# Patient Record
Sex: Female | Born: 1985
Health system: Southern US, Community
[De-identification: ages and names within clinical notes are randomized; demographics above are authoritative.]

## PROBLEM LIST (undated history)

## (undated) ENCOUNTER — Inpatient Hospital Stay (HOSPITAL_COMMUNITY): Payer: Self-pay

## (undated) DIAGNOSIS — Z789 Other specified health status: Secondary | ICD-10-CM

## (undated) HISTORY — PX: NO PAST SURGERIES: SHX2092

## (undated) HISTORY — PX: APPENDECTOMY: SHX54

## (undated) HISTORY — PX: BREAST ENHANCEMENT SURGERY: SHX7

---
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
## 2005-02-02 ENCOUNTER — Other Ambulatory Visit: Admission: RE | Admit: 2005-02-02 | Discharge: 2005-02-02 | Payer: Self-pay | Admitting: Family Medicine
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
## 2006-05-03 ENCOUNTER — Other Ambulatory Visit: Admission: RE | Admit: 2006-05-03 | Discharge: 2006-05-03 | Payer: Self-pay | Admitting: Family Medicine

## 2006-12-11 ENCOUNTER — Encounter: Admission: RE | Admit: 2006-12-11 | Discharge: 2006-12-11 | Payer: Self-pay | Admitting: Family Medicine

## 2006-12-17 ENCOUNTER — Encounter (INDEPENDENT_AMBULATORY_CARE_PROVIDER_SITE_OTHER): Payer: Self-pay | Admitting: Diagnostic Radiology

## 2006-12-17 ENCOUNTER — Encounter: Admission: RE | Admit: 2006-12-17 | Discharge: 2006-12-17 | Payer: Self-pay | Admitting: Family Medicine

## 2007-01-28 ENCOUNTER — Other Ambulatory Visit: Admission: RE | Admit: 2007-01-28 | Discharge: 2007-01-28 | Payer: Self-pay | Admitting: Family Medicine

## 2007-10-13 ENCOUNTER — Other Ambulatory Visit: Admission: RE | Admit: 2007-10-13 | Discharge: 2007-10-13 | Payer: Self-pay | Admitting: Obstetrics and Gynecology

## 2008-04-08 ENCOUNTER — Other Ambulatory Visit: Admission: RE | Admit: 2008-04-08 | Discharge: 2008-04-08 | Payer: Self-pay | Admitting: Obstetrics and Gynecology

## 2008-10-07 ENCOUNTER — Other Ambulatory Visit: Admission: RE | Admit: 2008-10-07 | Discharge: 2008-10-07 | Payer: Self-pay | Admitting: Obstetrics and Gynecology

## 2009-03-09 ENCOUNTER — Other Ambulatory Visit: Admission: RE | Admit: 2009-03-09 | Discharge: 2009-03-09 | Payer: Self-pay | Admitting: Obstetrics and Gynecology

## 2010-05-30 ENCOUNTER — Ambulatory Visit
Admission: RE | Admit: 2010-05-30 | Discharge: 2010-05-30 | Payer: Self-pay | Source: Home / Self Care | Attending: Urology | Admitting: Urology

## 2010-06-01 ENCOUNTER — Other Ambulatory Visit
Admission: RE | Admit: 2010-06-01 | Discharge: 2010-06-01 | Payer: Self-pay | Source: Home / Self Care | Admitting: Obstetrics and Gynecology

## 2010-08-07 LAB — POCT HEMOGLOBIN-HEMACUE: Hemoglobin: 13.6 g/dL (ref 12.0–15.0)

## 2010-08-07 LAB — POCT PREGNANCY, URINE: Preg Test, Ur: NEGATIVE

## 2011-05-31 ENCOUNTER — Other Ambulatory Visit: Payer: Self-pay | Admitting: Nurse Practitioner

## 2011-05-31 ENCOUNTER — Other Ambulatory Visit (HOSPITAL_COMMUNITY)
Admission: RE | Admit: 2011-05-31 | Discharge: 2011-05-31 | Disposition: A | Discharge status: Home / Self Care | Payer: 59 | Source: Ambulatory Visit | Attending: Obstetrics and Gynecology | Admitting: Obstetrics and Gynecology

## 2011-05-31 DIAGNOSIS — Z01419 Encounter for gynecological examination (general) (routine) without abnormal findings: Secondary | ICD-10-CM | POA: Insufficient documentation

## 2011-05-31 DIAGNOSIS — Z113 Encounter for screening for infections with a predominantly sexual mode of transmission: Secondary | ICD-10-CM | POA: Insufficient documentation

## 2012-01-16 ENCOUNTER — Other Ambulatory Visit: Payer: Self-pay | Admitting: Surgery

## 2012-05-22 ENCOUNTER — Other Ambulatory Visit (HOSPITAL_COMMUNITY)
Admission: RE | Admit: 2012-05-22 | Discharge: 2012-05-22 | Disposition: A | Discharge status: Home / Self Care | Payer: Managed Care, Other (non HMO) | Source: Ambulatory Visit | Attending: Obstetrics and Gynecology | Admitting: Obstetrics and Gynecology

## 2012-05-22 ENCOUNTER — Other Ambulatory Visit: Payer: Self-pay | Admitting: Nurse Practitioner

## 2012-05-22 DIAGNOSIS — Z01419 Encounter for gynecological examination (general) (routine) without abnormal findings: Secondary | ICD-10-CM | POA: Insufficient documentation

## 2013-12-27 ENCOUNTER — Emergency Department (HOSPITAL_COMMUNITY)
Admission: EM | Admit: 2013-12-27 | Discharge: 2013-12-27 | Disposition: A | Discharge status: Home / Self Care | Payer: BC Managed Care – PPO | Attending: Emergency Medicine | Admitting: Emergency Medicine

## 2013-12-27 ENCOUNTER — Emergency Department (HOSPITAL_COMMUNITY): Payer: BC Managed Care – PPO

## 2013-12-27 ENCOUNTER — Encounter (HOSPITAL_COMMUNITY): Payer: Self-pay | Admitting: Emergency Medicine

## 2013-12-27 DIAGNOSIS — W208XXA Other cause of strike by thrown, projected or falling object, initial encounter: Secondary | ICD-10-CM | POA: Insufficient documentation

## 2013-12-27 DIAGNOSIS — S8253XA Displaced fracture of medial malleolus of unspecified tibia, initial encounter for closed fracture: Secondary | ICD-10-CM | POA: Insufficient documentation

## 2013-12-27 DIAGNOSIS — S9030XA Contusion of unspecified foot, initial encounter: Secondary | ICD-10-CM | POA: Insufficient documentation

## 2013-12-27 DIAGNOSIS — S99919A Unspecified injury of unspecified ankle, initial encounter: Secondary | ICD-10-CM

## 2013-12-27 DIAGNOSIS — Y929 Unspecified place or not applicable: Secondary | ICD-10-CM | POA: Insufficient documentation

## 2013-12-27 DIAGNOSIS — S99929A Unspecified injury of unspecified foot, initial encounter: Secondary | ICD-10-CM

## 2013-12-27 DIAGNOSIS — S8990XA Unspecified injury of unspecified lower leg, initial encounter: Secondary | ICD-10-CM | POA: Insufficient documentation

## 2013-12-27 DIAGNOSIS — S9032XA Contusion of left foot, initial encounter: Secondary | ICD-10-CM

## 2013-12-27 DIAGNOSIS — S82892A Other fracture of left lower leg, initial encounter for closed fracture: Secondary | ICD-10-CM

## 2013-12-27 DIAGNOSIS — Y9389 Activity, other specified: Secondary | ICD-10-CM | POA: Insufficient documentation

## 2013-12-27 MED ORDER — IBUPROFEN 600 MG PO TABS: 600.0000 mg | ORAL_TABLET | Freq: Four times a day (QID) | ORAL | Status: DC | PRN

## 2013-12-27 MED ORDER — IBUPROFEN 800 MG PO TABS
800.0000 mg | ORAL_TABLET | Freq: Once | ORAL | Status: AC
  Administered 2013-12-27: 800 mg via ORAL
  Filled 2013-12-27: qty 1

## 2013-12-27 MED ORDER — HYDROCODONE-ACETAMINOPHEN 5-325 MG PO TABS
1.0000 | ORAL_TABLET | Freq: Once | ORAL | Status: AC
  Administered 2013-12-27: 1 via ORAL
  Filled 2013-12-27: qty 1

## 2013-12-27 MED ORDER — HYDROCODONE-ACETAMINOPHEN 5-325 MG PO TABS: ORAL_TABLET | ORAL | Status: DC

## 2013-12-27 NOTE — Discharge Instructions (Signed)
Ankle Fracture °A fracture is a break in a bone. The ankle joint is made up of three bones. These include the lower (distal) sections of your lower leg bones, called the tibia and fibula, along with a bone in your foot, called the talus. Depending on how bad the break is and if more than one ankle joint bone is broken, a cast or splint is used to protect and keep your injured bone from moving while it heals. Sometimes, surgery is required to help the fracture heal properly.  °There are two general types of fractures: °· Stable fracture. This includes a single fracture line through one bone, with no injury to ankle ligaments. A fracture of the talus that does not have any displacement (movement of the bone on either side of the fracture line) is also stable. °· Unstable fracture. This includes more than one fracture line through one or more bones in the ankle joint. It also includes fractures that have displacement of the bone on either side of the fracture line. °CAUSES °· A direct blow to the ankle.   °· Quickly and severely twisting your ankle. °· Trauma, such as a car accident or falling from a significant height. °RISK FACTORS °You may be at a higher risk of ankle fracture if: °· You have certain medical conditions. °· You are involved in high-impact sports. °· You are involved in a high-impact car accident. °SIGNS AND SYMPTOMS  °· Tender and swollen ankle. °· Bruising around the injured ankle. °· Pain on movement of the ankle. °· Difficulty walking or putting weight on the ankle. °· A cold foot below the site of the ankle injury. This can occur if the blood vessels passing through your injured ankle were also damaged. °· Numbness in the foot below the site of the ankle injury. °DIAGNOSIS  °An ankle fracture is usually diagnosed with a physical exam and X-rays. A CT scan may also be required for complex fractures. °TREATMENT  °Stable fractures are treated with a cast or splint and using crutches to avoid putting  weight on your injured ankle. This is followed by an ankle strengthening program. Some patients require a special type of cast, depending on other medical problems they may have. Unstable fractures require surgery to ensure the bones heal properly. Your health care provider will tell you what type of fracture you have and the best treatment for your condition. °HOME CARE INSTRUCTIONS  °· Review correct crutch use with your health care provider and use your crutches as directed. Safe use of crutches is extremely important. Misuse of crutches can cause you to fall or cause injury to nerves in your hands or armpits. °· Do not put weight or pressure on the injured ankle until directed by your health care provider. °· To lessen the swelling, keep the injured leg elevated while sitting or lying down. °· Apply ice to the injured area: °¨ Put ice in a plastic bag. °¨ Place a towel between your cast and the bag. °¨ Leave the ice on for 20 minutes, 2-3 times a day. °· If you have a plaster or fiberglass cast: °¨ Do not try to scratch the skin under the cast with any objects. This can increase your risk of skin infection. °¨ Check the skin around the cast every day. You may put lotion on any red or sore areas. °¨ Keep your cast dry and clean. °· If you have a plaster splint: °¨ Wear the splint as directed. °¨ You may loosen the elastic   around the splint if your toes become numb, tingle, or turn cold or blue.  Do not put pressure on any part of your cast or splint; it may break. Rest your cast only on a pillow the first 24 hours until it is fully hardened.  Your cast or splint can be protected during bathing with a plastic bag sealed to your skin with medical tape. Do not lower the cast or splint into water.  Take medicines as directed by your health care provider. Only take over-the-counter or prescription medicines for pain, discomfort, or fever as directed by your health care provider.  Do not drive a vehicle until  your health care provider specifically tells you it is safe to do so.  If your health care provider has given you a follow-up appointment, it is very important to keep that appointment. Not keeping the appointment could result in a chronic or permanent injury, pain, and disability. If you have any problem keeping the appointment, call the facility for assistance. SEEK MEDICAL CARE IF: You develop increased swelling or discomfort. SEEK IMMEDIATE MEDICAL CARE IF:   Your cast gets damaged or breaks.  You have continued severe pain.  You develop new pain or swelling after the cast was put on.  Your skin or toenails below the injury turn blue or gray.  Your skin or toenails below the injury feel cold, numb, or have loss of sensitivity to touch.  There is a bad smell or pus draining from under the cast. MAKE SURE YOU:   Understand these instructions.  Will watch your condition.  Will get help right away if you are not doing well or get worse. Document Released: 05/11/2000 Document Revised: 05/19/2013 Document Reviewed: 12/11/2012 [facility] Patient Information 2015 [place], [place]. This information is not intended to replace advice given to you by your health care provider. Make sure you discuss any questions you have with your health care provider.  Contusion A contusion is a deep bruise. Contusions happen when an injury causes bleeding under the skin. Signs of bruising include pain, puffiness (swelling), and discolored skin. The contusion may turn blue, purple, or yellow. HOME CARE   Put ice on the injured area.  Put ice in a plastic bag.  Place a towel between your skin and the bag.  Leave the ice on for 15-20 minutes, 03-04 times a day.  Only take medicine as told by your doctor.  Rest the injured area.  If possible, raise (elevate) the injured area to lessen puffiness. GET HELP RIGHT AWAY IF:   You have more bruising or puffiness.  You have pain that is getting  worse.  Your puffiness or pain is not helped by medicine. MAKE SURE YOU:   Understand these instructions.  Will watch your condition.  Will get help right away if you are not doing well or get worse. Document Released: 10/31/2007 Document Revised: 08/06/2011 Document Reviewed: 03/19/2011 [facility] Patient Information 2015 [place], [place]. This information is not intended to replace advice given to you by your health care provider. Make sure you discuss any questions you have with your health care provider.
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
## 2013-12-30 NOTE — ED Provider Notes (Signed)
Medical screening examination/treatment/procedure(s) were performed by non-physician practitioner and as supervising physician I was immediately available for consultation/collaboration.   EKG Interpretation None        Samuel JesterKathleen Conda Wannamaker, DO 12/30/13 1631

## 2014-08-19 ENCOUNTER — Other Ambulatory Visit: Payer: Self-pay | Admitting: Nurse Practitioner

## 2014-08-19 ENCOUNTER — Other Ambulatory Visit (HOSPITAL_COMMUNITY)
Admission: RE | Admit: 2014-08-19 | Discharge: 2014-08-19 | Disposition: A | Discharge status: Home / Self Care | Payer: BLUE CROSS/BLUE SHIELD | Source: Ambulatory Visit | Attending: Nurse Practitioner | Admitting: Nurse Practitioner

## 2014-08-19 DIAGNOSIS — Z01419 Encounter for gynecological examination (general) (routine) without abnormal findings: Secondary | ICD-10-CM | POA: Insufficient documentation

## 2014-08-19 DIAGNOSIS — Z1151 Encounter for screening for human papillomavirus (HPV): Secondary | ICD-10-CM | POA: Insufficient documentation

## 2014-08-24 LAB — CYTOLOGY - PAP

## 2015-01-13 LAB — OB RESULTS CONSOLE RUBELLA ANTIBODY, IGM: Rubella: IMMUNE

## 2015-01-13 LAB — OB RESULTS CONSOLE ANTIBODY SCREEN: ANTIBODY SCREEN: NEGATIVE

## 2015-01-13 LAB — OB RESULTS CONSOLE HIV ANTIBODY (ROUTINE TESTING): HIV: NONREACTIVE

## 2015-01-13 LAB — OB RESULTS CONSOLE HEPATITIS B SURFACE ANTIGEN: Hepatitis B Surface Ag: NEGATIVE

## 2015-01-13 LAB — OB RESULTS CONSOLE ABO/RH: RH TYPE: POSITIVE

## 2015-01-13 LAB — OB RESULTS CONSOLE RPR: RPR: NONREACTIVE

## 2015-02-04 LAB — OB RESULTS CONSOLE GC/CHLAMYDIA
Chlamydia: NEGATIVE
Gonorrhea: NEGATIVE

## 2015-04-14 IMAGING — CR DG OS CALCIS 2+V*L*
2 series · 2 of 2 positions shown · non-contrast
Comparison: None.

CLINICAL DATA: Os calcis

EXAM:
LEFT OS CALCIS - 2+ VIEW

[view not recorded (1 of 2)]
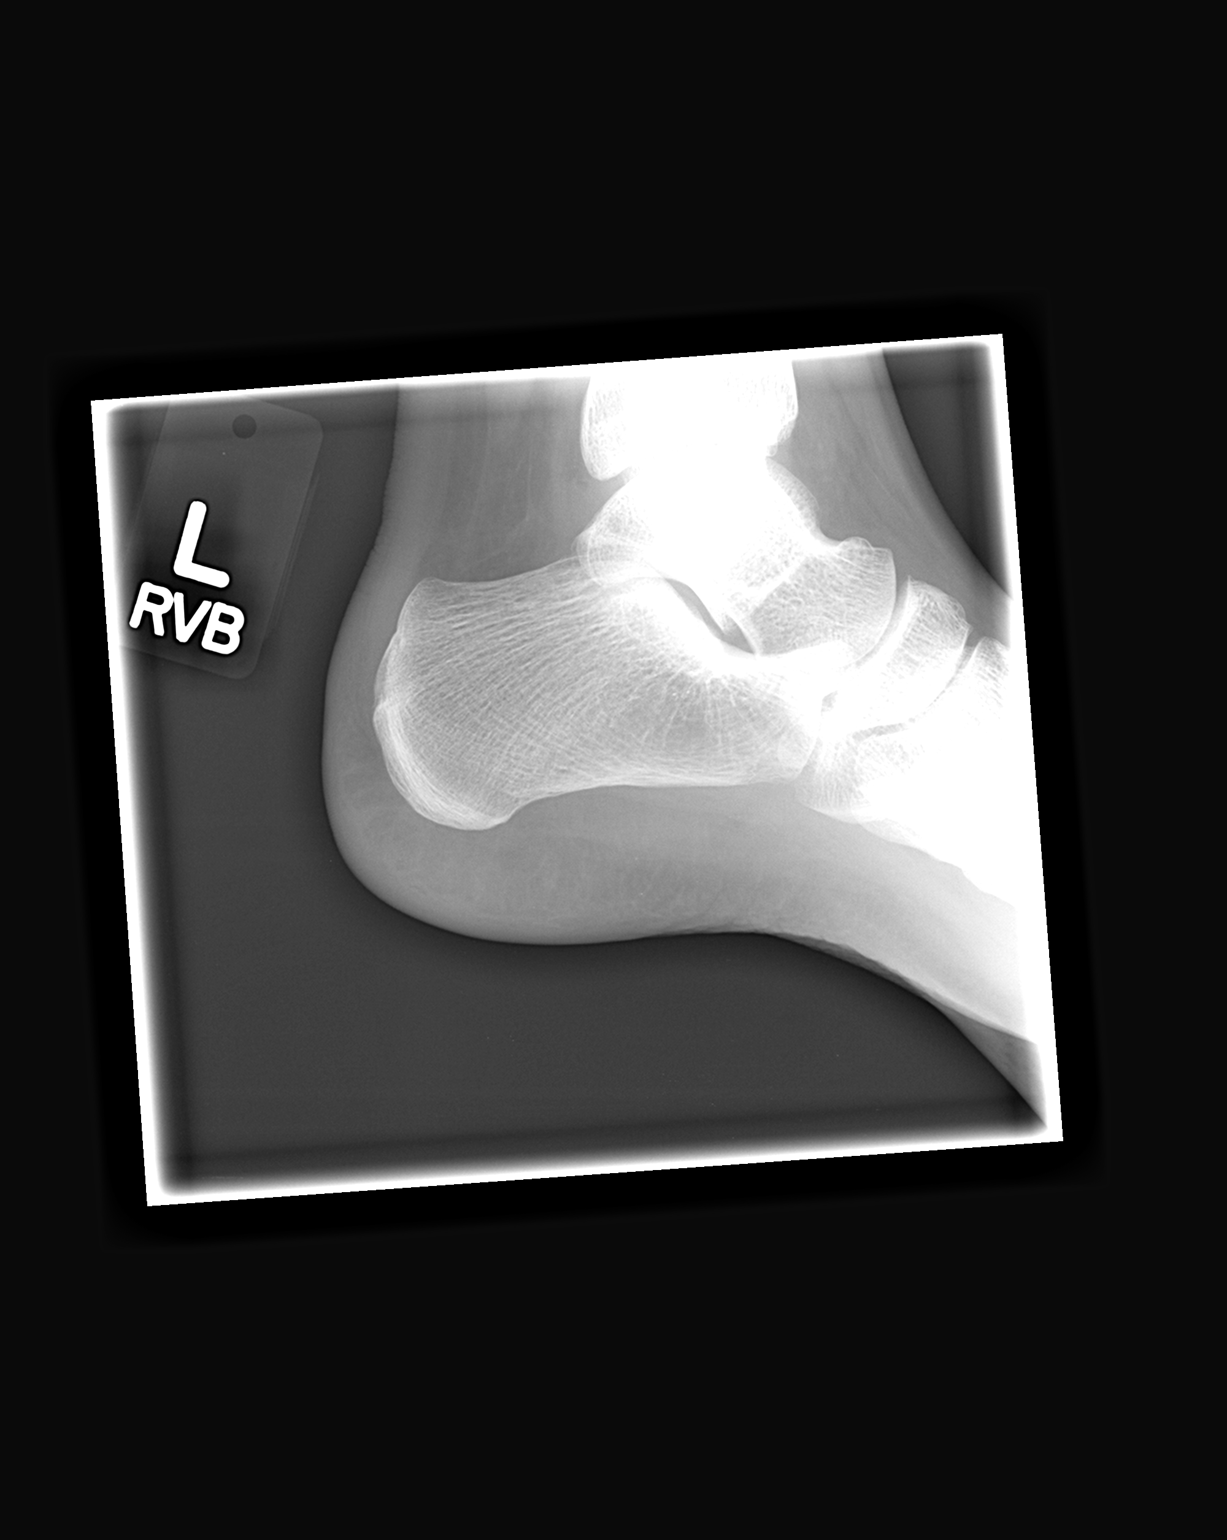

[view not recorded (2 of 2)]
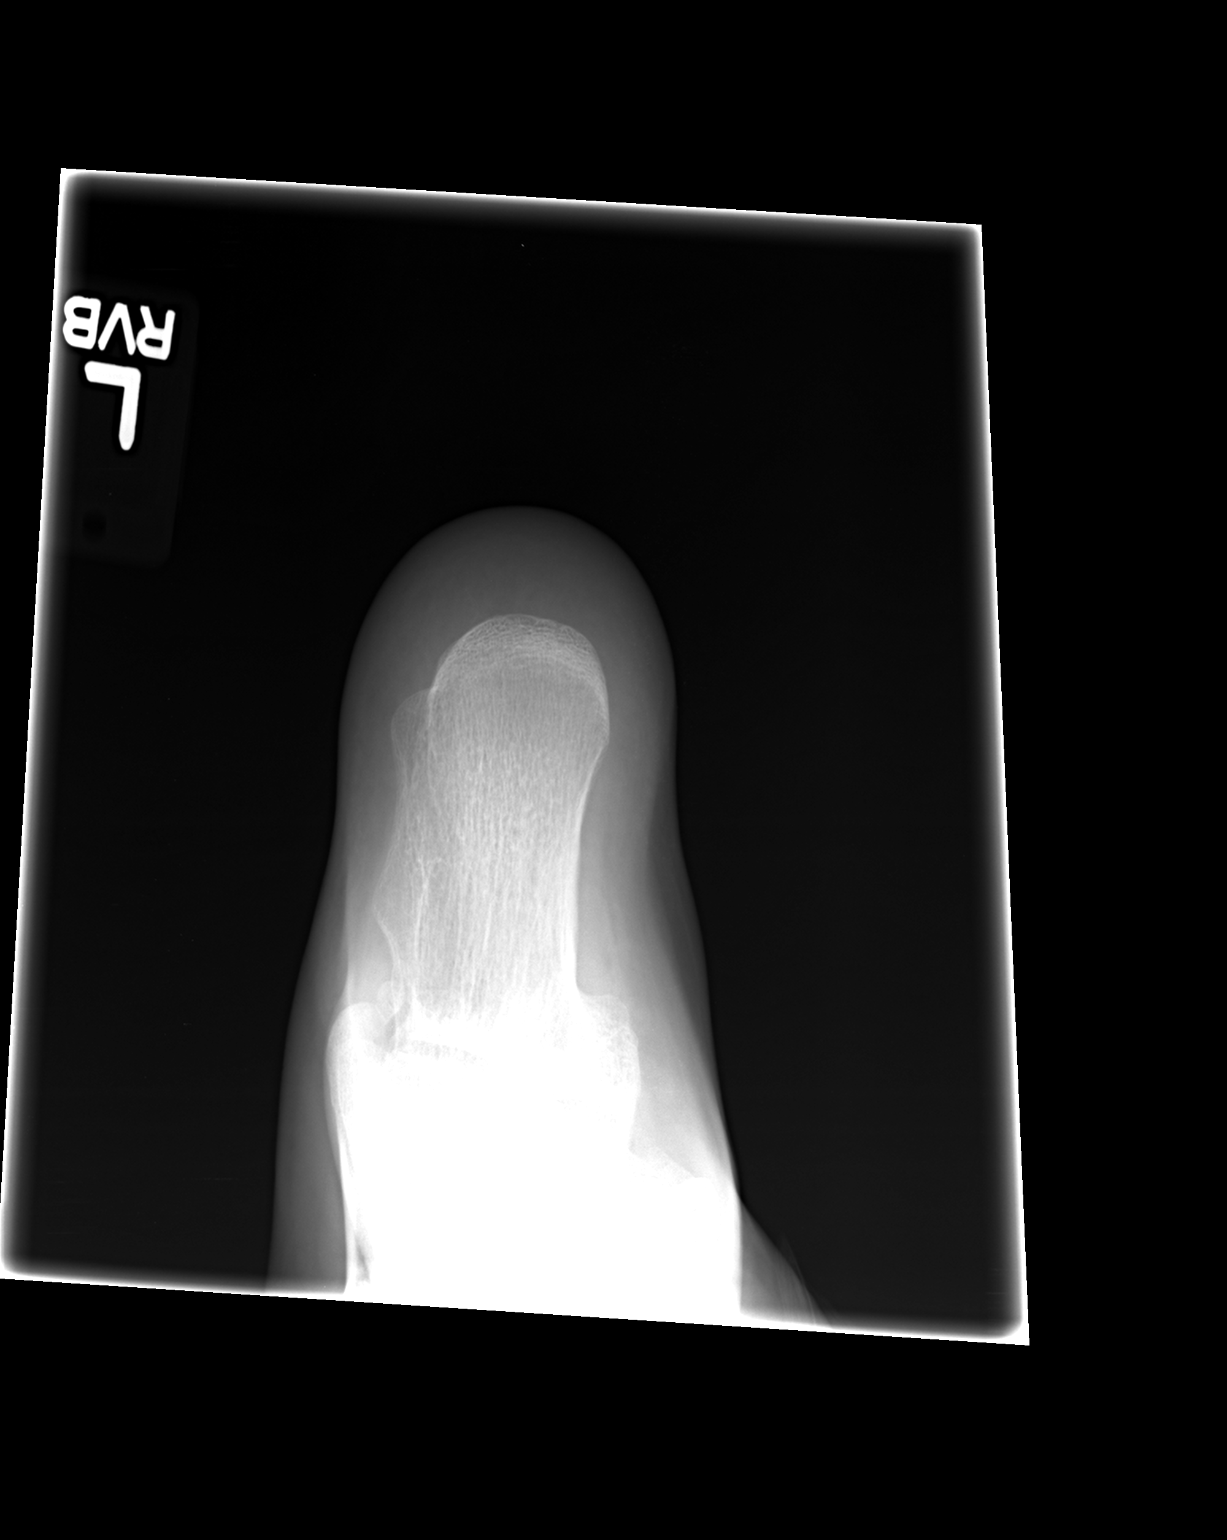

[2 of 2 positions shown; findings below may reference images not displayed]

FINDINGS: No fracture.  Note dislocation.
IMPRESSION: No acute bony pathology.

## 2015-07-17 ENCOUNTER — Encounter (HOSPITAL_COMMUNITY): Payer: Self-pay | Admitting: *Deleted

## 2015-07-17 ENCOUNTER — Inpatient Hospital Stay (HOSPITAL_COMMUNITY)
Admission: AD | Admit: 2015-07-17 | Discharge: 2015-07-17 | Disposition: A | Discharge status: Home / Self Care | Payer: BLUE CROSS/BLUE SHIELD | Source: Ambulatory Visit | Attending: Obstetrics & Gynecology | Admitting: Obstetrics & Gynecology

## 2015-07-17 DIAGNOSIS — R059 Cough, unspecified: Secondary | ICD-10-CM

## 2015-07-17 DIAGNOSIS — R1032 Left lower quadrant pain: Secondary | ICD-10-CM | POA: Diagnosis not present

## 2015-07-17 DIAGNOSIS — O9989 Other specified diseases and conditions complicating pregnancy, childbirth and the puerperium: Secondary | ICD-10-CM

## 2015-07-17 DIAGNOSIS — R1031 Right lower quadrant pain: Secondary | ICD-10-CM | POA: Insufficient documentation

## 2015-07-17 DIAGNOSIS — M549 Dorsalgia, unspecified: Secondary | ICD-10-CM | POA: Diagnosis not present

## 2015-07-17 DIAGNOSIS — R05 Cough: Secondary | ICD-10-CM | POA: Diagnosis not present

## 2015-07-17 DIAGNOSIS — Z3A33 33 weeks gestation of pregnancy: Secondary | ICD-10-CM | POA: Insufficient documentation

## 2015-07-17 DIAGNOSIS — O26893 Other specified pregnancy related conditions, third trimester: Secondary | ICD-10-CM | POA: Diagnosis not present

## 2015-07-17 DIAGNOSIS — O99891 Other specified diseases and conditions complicating pregnancy: Secondary | ICD-10-CM

## 2015-07-17 DIAGNOSIS — R109 Unspecified abdominal pain: Secondary | ICD-10-CM | POA: Diagnosis present

## 2015-07-17 HISTORY — DX: Other specified health status: Z78.9

## 2015-07-17 LAB — URINALYSIS, ROUTINE W REFLEX MICROSCOPIC
Bilirubin Urine: NEGATIVE
Glucose, UA: NEGATIVE mg/dL
Hgb urine dipstick: NEGATIVE
Ketones, ur: NEGATIVE mg/dL
Leukocytes, UA: NEGATIVE
Nitrite: NEGATIVE
Protein, ur: NEGATIVE mg/dL
Specific Gravity, Urine: <1.005 — ABNORMAL LOW (ref 1.005–1.030)
pH: 5.5 (ref 5.0–8.0)

## 2015-07-17 MED ORDER — CYCLOBENZAPRINE HCL 10 MG PO TABS: 10.0000 mg | ORAL_TABLET | Freq: Two times a day (BID) | ORAL | Status: DC | PRN

## 2015-07-17 MED ORDER — BENZONATATE 200 MG PO CAPS: 200.0000 mg | ORAL_CAPSULE | Freq: Three times a day (TID) | ORAL | Status: DC | PRN

## 2015-07-17 MED ORDER — CYCLOBENZAPRINE HCL 10 MG PO TABS
10.0000 mg | ORAL_TABLET | Freq: Once | ORAL | Status: AC
  Administered 2015-07-17: 10 mg via ORAL
  Filled 2015-07-17: qty 1

## 2015-07-17 NOTE — Discharge Instructions (Signed)
Abdominal Pain During Pregnancy Abdominal pain is common in pregnancy. Most of the time, it does not cause harm. There are many causes of abdominal pain. Some causes are more serious than others. Some of the causes of abdominal pain in pregnancy are easily diagnosed. Occasionally, the diagnosis takes time to understand. Other times, the cause is not determined. Abdominal pain can be a sign that something is very wrong with the pregnancy, or the pain may have nothing to do with the pregnancy at all. For this reason, always tell your health care provider if you have any abdominal discomfort. HOME CARE INSTRUCTIONS  Monitor your abdominal pain for any changes. The following actions may help to alleviate any discomfort you are experiencing:  Do not have sexual intercourse or put anything in your vagina until your symptoms go away completely.  Get plenty of rest until your pain improves.  Drink clear fluids if you feel nauseous. Avoid solid food as long as you are uncomfortable or nauseous.  Only take over-the-counter or prescription medicine as directed by your health care provider.  Keep all follow-up appointments with your health care provider. SEEK IMMEDIATE MEDICAL CARE IF:  You are bleeding, leaking fluid, or passing tissue from the vagina.  You have increasing pain or cramping.  You have persistent vomiting.  You have painful or bloody urination.  You have a fever.  You notice a decrease in your baby's movements.  You have extreme weakness or feel faint.  You have shortness of breath, with or without abdominal pain.  You develop a severe headache with abdominal pain.  You have abnormal vaginal discharge with abdominal pain.  You have persistent diarrhea.  You have abdominal pain that continues even after rest, or gets worse. MAKE SURE YOU:   Understand these instructions.  Will watch your condition.  Will get help right away if you are not doing well or get worse.     This information is not intended to replace advice given to you by your health care provider. Make sure you discuss any questions you have with your health care provider.   Document Released: 05/14/2005 Document Revised: 03/04/2013 Document Reviewed: 12/11/2012 Elsevier Interactive Patient Education 2016 [employer] Upper Respiratory Infection, Adult Most upper respiratory infections (URIs) are a viral infection of the air passages leading to the lungs. A URI affects the nose, throat, and upper air passages. The most common type of URI is nasopharyngitis and is typically referred to as "the common cold." URIs run their course and usually go away on their own. Most of the time, a URI does not require medical attention, but sometimes a bacterial infection in the upper airways can follow a viral infection. This is called a secondary infection. Sinus and middle ear infections are common types of secondary upper respiratory infections. Bacterial pneumonia can also complicate a URI. A URI can worsen asthma and chronic obstructive pulmonary disease (COPD). Sometimes, these complications can require emergency medical care and may be life threatening.  CAUSES Almost all URIs are caused by viruses. A virus is a type of germ and can spread from one person to another.  RISKS FACTORS You may be at risk for a URI if:   You smoke.   You have chronic heart or lung disease.  You have a weakened defense (immune) system.   You are very young or very old.   You have nasal allergies or asthma.  You work in crowded or poorly ventilated areas.  You work in [employer]  care facilities or schools. SIGNS AND SYMPTOMS  Symptoms typically develop 2-3 days after you come in contact with a cold virus. Most viral URIs last 7-10 days. However, viral URIs from the influenza virus (flu virus) can last 14-18 days and are typically more severe. Symptoms may include:   Runny or stuffy (congested) nose.   Sneezing.    Cough.   Sore throat.   Headache.   Fatigue.   Fever.   Loss of appetite.   Pain in your forehead, behind your eyes, and over your cheekbones (sinus pain).  Muscle aches.  DIAGNOSIS  Your health care provider may diagnose a URI by:  Physical exam.  Tests to check that your symptoms are not due to another condition such as:  Strep throat.  Sinusitis.  Pneumonia.  Asthma. TREATMENT  A URI goes away on its own with time. It cannot be cured with medicines, but medicines may be prescribed or recommended to relieve symptoms. Medicines may help:  Reduce your fever.  Reduce your cough.  Relieve nasal congestion. HOME CARE INSTRUCTIONS   Take medicines only as directed by your health care provider.   Gargle warm saltwater or take cough drops to comfort your throat as directed by your health care provider.  Use a warm mist humidifier or inhale steam from a shower to increase air moisture. This may make it easier to breathe.  Drink enough fluid to keep your urine clear or pale yellow.   Eat soups and other clear broths and maintain good nutrition.   Rest as needed.   Return to work when your temperature has returned to normal or as your health care provider advises. You may need to stay home longer to avoid infecting others. You can also use a face mask and careful hand washing to prevent spread of the virus.  Increase the usage of your inhaler if you have asthma.   Do not use any tobacco products, including cigarettes, chewing tobacco, or electronic cigarettes. If you need help quitting, ask your health care provider. PREVENTION  The best way to protect yourself from getting a cold is to practice good hygiene.   Avoid oral or hand contact with people with cold symptoms.   Wash your hands often if contact occurs.  There is no clear evidence that vitamin C, vitamin E, echinacea, or exercise reduces the chance of developing a cold. However, it is  always recommended to get plenty of rest, exercise, and practice good nutrition.  SEEK MEDICAL CARE IF:   You are getting worse rather than better.   Your symptoms are not controlled by medicine.   You have chills.  You have worsening shortness of breath.  You have brown or red mucus.  You have yellow or brown nasal discharge.  You have pain in your face, especially when you bend forward.  You have a fever.  You have swollen neck glands.  You have pain while swallowing.  You have white areas in the back of your throat. SEEK IMMEDIATE MEDICAL CARE IF:   You have severe or persistent:  Headache.  Ear pain.  Sinus pain.  Chest pain.  You have chronic lung disease and any of the following:  Wheezing.  Prolonged cough.  Coughing up blood.  A change in your usual mucus.  You have a stiff neck.  You have changes in your:  Vision.  Hearing.  Thinking.  Mood. MAKE SURE YOU:   Understand these instructions.  Will watch your condition.  Will  get help right away if you are not doing well or get worse.   This information is not intended to replace advice given to you by your health care provider. Make sure you discuss any questions you have with your health care provider.   Document Released: 11/07/2000 Document Revised: 09/28/2014 Document Reviewed: 08/19/2013 Elsevier Interactive Patient Education 2016 [employer].   Safe Medications in Pregnancy   Acne:  Benzoyl Peroxide  Salicylic Acid   Backache/Headache:  Tylenol: 2 regular strength every 4 hours OR        2 Extra strength every 6 hours   Colds/Coughs/Allergies:  Benadryl (alcohol free) 25 mg every 6 hours as needed  Breath right strips  Claritin  Cepacol throat lozenges  Chloraseptic throat spray  Cold-Eeze- up to three times per day  Cough drops, alcohol free  Flonase (by prescription only)  Guaifenesin  Mucinex  Robitussin DM (plain only, alcohol free)  Saline nasal  spray/drops  Sudafed (pseudoephedrine) & Actifed * use only after [redacted] weeks gestation and if you do not have high blood pressure  Tylenol  Vicks Vaporub  Zinc lozenges  Zyrtec   Constipation:  Colace  Ducolax suppositories  Fleet enema  Glycerin suppositories  Metamucil  Milk of magnesia  Miralax  Senokot  Smooth move tea   Diarrhea:  Kaopectate  Imodium A-D   *NO pepto Bismol   Hemorrhoids:  Anusol  Anusol HC  Preparation H  Tucks   Indigestion:  Tums  Maalox  Mylanta  Zantac  Pepcid   Insomnia:  Benadryl (alcohol free)  every 6 hours as needed  Tylenol PM  Unisom, no Gelcaps   Leg Cramps:  Tums  MagGel   Nausea/Vomiting:  Bonine  Dramamine  Emetrol  Ginger extract  Sea bands  Meclizine  Nausea medication to take during pregnancy:  Unisom (doxylamine succinate 25 mg tablets) Take one tablet daily at bedtime. If symptoms are not adequately controlled, the dose can be increased to a maximum recommended dose of two tablets daily (1/2 tablet in the morning, 1/2 tablet mid-afternoon and one at bedtime).  Vitamin B6  tablets. Take one tablet twice a day (up to 200 mg per day).   Skin Rashes:  Aveeno products  Benadryl cream or  every 6 hours as needed  Calamine Lotion  1% cortisone cream   Yeast infection:  Gyne-lotrimin 7  Monistat 7    **If taking multiple medications, please check labels to avoid duplicating the same active ingredients  **take medication as directed on the label  ** Do not exceed 4000 mg of tylenol in 24 hours  **Do not take medications that contain aspirin or ibuprofen

## 2015-07-17 NOTE — MAU Note (Signed)
Pt states she has had a cold for the past few day and has been coughing.  Pt states she has a physical therapy appointment for tomorrow because she was told the baby is lying on a nerve.  Pt states she is feeling the baby move.

## 2015-07-17 NOTE — MAU Provider Note (Signed)
History     CSN: [id number]  Arrival date and time:[id number]   None     Chief Complaint  Patient presents with  . Abdominal Cramping   HPI   Ms.Robin Palmer is a 30 y.o. female G1P0 at [redacted]w[redacted]d presenting to MAU with abdominal cramping, cough and lower back pain. + cough for a few days now. Taking over the counter theraflu with some relief. Occasionally the cough is keeping her awake at night. Denies fever.   Her lower back is constant, it has gotten worse in the third trimester of pregnancy. She is not wearing a pregnancy support belt.  The abdominal cramping is located in the lower part of her abdomen worse on the left side. The cramping started at 0700. She has tried taking tylenol, which has not helped. Nothing makes the pain worse, nothing makes the pain better.  The pain does not radiate  + fetal movement Denies vaginal bleeding.   Next appointment in the office is March 2nd No intercourse in the last 24 hours.  OB History    Gravida Para Term Preterm AB TAB SAB Ectopic Multiple Living   1               Past Medical History  Diagnosis Date  . Medical history non-contributory     Past Surgical History  Procedure Laterality Date  . No past surgeries      History reviewed. No pertinent family history.  Social History  Substance Use Topics  . Smoking status: Never Smoker   . Smokeless tobacco: None  . Alcohol Use: No    Allergies: No Known Allergies  Prescriptions prior to admission  Medication Sig Dispense Refill Last Dose  . HYDROcodone-acetaminophen (NORCO/VICODIN) 5-325 MG per tablet Take one-two tabs po q 4-6 hrs prn pain 20 tablet 0   . ibuprofen (ADVIL,MOTRIN) 600 MG tablet Take 1 tablet (600 mg total) by mouth every 6 (six) hours as needed. 21 tablet 0   . Levonorgestrel-Ethinyl Estrad (AVIANE PO) Take 1 tablet by mouth daily.   12/26/2013   Results for orders placed or performed during the hospital encounter of 07/17/15 (from the past 48  hour(s))  Urinalysis, Routine w reflex microscopic (not at [facility])     Status: Abnormal   Collection Time: 07/17/15  5:27 PM  Result Value Ref Range   Color, Urine YELLOW YELLOW   APPearance CLEAR CLEAR   Specific Gravity, Urine <1.005 (L) 1.005 - 1.030   pH 5.5 5.0 - 8.0   Glucose, UA NEGATIVE NEGATIVE mg/dL   Hgb urine dipstick NEGATIVE NEGATIVE   Bilirubin Urine NEGATIVE NEGATIVE   Ketones, ur NEGATIVE NEGATIVE mg/dL   Protein, ur NEGATIVE NEGATIVE mg/dL   Nitrite NEGATIVE NEGATIVE   Leukocytes, UA NEGATIVE NEGATIVE    Comment: MICROSCOPIC NOT DONE ON URINES WITH NEGATIVE PROTEIN, BLOOD, LEUKOCYTES, NITRITE, OR GLUCOSE <1000 mg/dL.    Review of Systems  Constitutional: Negative for fever and chills.  Respiratory: Positive for cough.   Gastrointestinal: Positive for abdominal pain. Negative for diarrhea and constipation.   Physical Exam   Blood pressure 116/85, pulse 99, temperature 98.1 F (36.7 C), temperature source Oral, resp. rate 16, last menstrual period 11/27/2014.  Physical Exam  Constitutional: She is oriented to person, place, and time. She appears well-developed and well-nourished. No distress.  HENT:  Head: Normocephalic.  Eyes: Pupils are equal, round, and reactive to light.  Cardiovascular: Normal rate and normal heart sounds.   Respiratory: Effort normal  and breath sounds normal. No respiratory distress. She has no wheezes. She has no rales. She exhibits no tenderness.  GI: Soft. She exhibits no distension. There is no tenderness. There is no rebound.  Genitourinary:  Dilation: Closed Cervical Position: Posterior Exam by:: Venia Carbon, NP  Musculoskeletal: Normal range of motion.  Neurological: She is alert and oriented to person, place, and time.  Skin: Skin is warm. She is not diaphoretic.  Psychiatric: Her behavior is normal.    Fetal Tracing: Baseline: 125 bpm  Variability: moderate  Accelerations: 15x15 Decelerations: None Toco: UI  MAU  Course  Procedures  None   MDM Flexeril 10 mg for lower back pain Discussed HPI< Labs, and plan of care in MAU with Dr. Sallye Ober.   Patient's back pain is down to a 2  Assessment and Plan   A:  1. Back pain affecting pregnancy in third trimester   2. Bilateral lower abdominal cramping   3. Cough     P:  Discharge home in stable condition RX: Tesslon pearls, flexeril Follow up with Dr. Charlotta Newton as scheduled A list of safe over the counter URI medications given Return to MAU if symptoms worsen    Duane Lope, NP 07/17/2015 5:55 PM

## 2015-07-18 ENCOUNTER — Ambulatory Visit: Payer: BLUE CROSS/BLUE SHIELD | Attending: Obstetrics & Gynecology

## 2015-07-18 DIAGNOSIS — M5441 Lumbago with sciatica, right side: Secondary | ICD-10-CM

## 2015-07-18 NOTE — Therapy (Addendum)
[facility] [address] [place], [place], [address] Phone: [phone]   Fax:  [phone]  Physical Therapy Evaluation  Patient Details  Name: Robin Palmer MRN: [id number] Date of Birth: Mar 22, 1986 Referring Provider: Janyth Pupa, DO  Encounter Date: 07/18/2015      PT End of Session - 07/18/15 0925    Visit Number 1   Date for PT Re-Evaluation 09/12/15   PT Start Time 0851   PT Stop Time 0925   PT Time Calculation (min) 34 min   Activity Tolerance Patient tolerated treatment well   Behavior During Therapy [facility] for tasks assessed/performed      Past Medical History  Diagnosis Date  . Medical history non-contributory     Past Surgical History  Procedure Laterality Date  . No past surgeries      There were no vitals filed for this visit.  Visit Diagnosis:  Bilateral low back pain with right-sided sciatica - Plan: PT plan of care cert/re-cert      Subjective Assessment - 07/18/15 0859    Subjective Pt is a 30 y.o. female in her 3rd trimester of pregnancy who presents to PT with 3 month history of LBP and 1.5 month history of Rt LE radiculopathy.  No incident or injury.  No imaging due to pregnancy.     Pertinent History none   Limitations Standing;Sitting   How long can you sit comfortably? sometimes limited if having a bad day   How long can you stand comfortably? 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  Standing limited t o1 hour   Diagnostic tests none   Patient Stated Goals reduce pain   Currently in Pain? Yes   Pain Score 3    Pain Location Back   Pain Orientation Right;Lower;Left   Pain Descriptors / Indicators Aching   Pain Type Acute pain   Pain Radiating Towards Rt leg (up to 5-6/10)   Pain Onset More than a month ago   Pain Frequency Constant   Aggravating Factors  no specific pattern   Pain Relieving Factors nothing per pt report            [facility] PT Assessment - 07/18/15 0001     Assessment   Medical Diagnosis sciatica in pregnancy    Referring Provider Janyth Pupa, DO   Onset Date/Surgical Date 05/17/15   Next MD Visit 07/28/15   Precautions   Precautions Other (comment)  pt is pregnant   Restrictions   Weight Bearing Restrictions No   Balance Screen   Has the patient fallen in the past 6 months No   Has the patient had a decrease in activity level because of a fear of falling?  No   Is the patient reluctant to leave their home because of a fear of falling?  No   Home Environment   Living Environment Private residence   Living Arrangements Spouse/significant other   Type of [place]   Prior Function   Level of Independence Independent   Vocation Full time employment   Vocation Requirements [employer]- works 10 hours   Leisure gym 3x/week   Cognition   Overall Cognitive Status Within Functional Limits for tasks assessed   Observation/Other Assessments   Focus on Therapeutic Outcomes (FOTO)  53% limitation   Posture/Postural Control   Posture/Postural Control Postural limitations   Postural Limitations Increased lumbar lordosis   ROM / Strength   AROM / PROM / Strength AROM;PROM;Strength   AROM  Overall AROM  Within functional limits for tasks performed   Overall AROM Comments Full lumbar AROM wihtout pain today   PROM   Overall PROM  Within functional limits for tasks performed   Overall PROM Comments hamstring flexiblity limited by 20% bilaterally.  All other hip PROM is full without pain   Strength   Overall Strength Within functional limits for tasks performed   Overall Strength Comments 5/5 bil LE strength   Palpation   Palpation comment mild palpable tenderness.  No pain in deep gluteals.     Ambulation/Gait   Ambulation/Gait Yes   Ambulation Distance (Feet) 100 Feet   Gait Pattern Within Functional Limits                           PT Education - 07/18/15 0920    Education provided Yes   Education Details HEP and  body mechanics educaiton   Person(s) Educated Patient   Methods Explanation;Demonstration;Handout   Comprehension Verbalized understanding          PT Short Term Goals - 07/18/15 0929    PT SHORT TERM GOAL #1   Title be independent in initial HEP   Time 4   Period Weeks   Status New   PT SHORT TERM GOAL #2   Title report a 25% reduciton in Rt LE radiculopathy with work and ADLs   Time 4   Period Weeks   Status New           PT Long Term Goals - 07/18/15 0930    PT LONG TERM GOAL #1   Title be independent in advanced HEP   Time 8   Period Weeks   Status New   PT LONG TERM GOAL #2   Title report a 50% reduction in Rt LE radiculopathy with ADLs and work tasks   Time 8   Period Weeks   Status New   PT LONG TERM GOAL #3   Title demonstrate and verbalize correct body mechanics modifications to protect lumbar spine with work and home tasks   Time 8   Period Weeks   Status New   PT LONG TERM GOAL #4   Title report a 50% reduction in LBP with standing at work   Time [place] - 07/18/15 0925    Clinical Impression Statement Pt is a 30 y.o. female in her 3rd trimester of pregnancy who presents to PT with bilateral LBP and Rt LE radiculopathy that began 1.5-3 months ago without cause. Pt rates LBP as 2-3/10 and Rt LE pain as 5-6/10 and this is intermittent.  FOTO score is 53% limitation.  Pt demonstates mild tenderness in the lumbar paraspinals and tension in the deep gluteals.  Pt will benefit from skilled PT for flexiblity, core strength, body mechanics and manual therapy as needed to reduce pain and allow for standing longer at work.     Pt will benefit from skilled therapeutic intervention in order to improve on the following deficits Pain;Postural dysfunction;Improper body mechanics;Decreased activity tolerance   Rehab Potential Good   PT Frequency 1x / week   PT Duration 8 weeks   PT Treatment/Interventions ADLs/Self  Care Home Management;Cryotherapy;Moist Heat;Therapeutic exercise;Therapeutic activities;Functional mobility training;Neuromuscular re-education;Patient/family education;Manual techniques;Passive range of motion   PT Next Visit Plan Practice postural corrections, flexiblity exercises, core strength, manual to  deep gluteals and lumbar paraspinals.     Consulted and Agree with Plan of Care Patient         Problem List There are no active problems to display for this patient.   Ciaran Begay, PT 07/18/2015, 9:33 AM PHYSICAL THERAPY DISCHARGE SUMMARY  Visits from Start of Care: 1  Current functional level related to goals / functional outcomes: Pt attended 1 PT session and then didn't return for further appts.     Remaining deficits: Unknown as pt didn't return to PT.  See above for most current status.     Education / Equipment: HEP Plan: Patient agrees to discharge.  Patient goals were not met. Patient is being discharged due to not returning since the last visit.  ?????    Sigurd Sos, PT 08/17/2015 3:16 PM  [facility] [address], [place], [place], [address] Phone: [phone]   Fax:  [phone]  Name: Robin Palmer MRN: [id number] Date of Birth: [birth year]

## 2015-07-18 NOTE — Patient Instructions (Signed)
   Lifting Principles  .Maintain proper posture and head alignment. .Slide object as close as possible before lifting. .Move obstacles out of the way. .Test before lifting; ask for help if too heavy. .Tighten stomach muscles without holding breath. .Use smooth movements; do not jerk. .Use legs to do the work, and pivot with feet. .Distribute the work load symmetrically and close to the center of trunk. .Push instead of pull whenever possible.   Squat down and hold basket close to stand. Use leg muscles to do the work.    Avoid twisting or bending back. Pivot around using foot movements, and bend at knees if needed when reaching for articles.        Getting Into / Out of Bed   Lower self to lie down on one side by raising legs and lowering head at the same time. Use arms to assist moving without twisting. Bend both knees to roll onto back if desired. To sit up, start from lying on side, and use same move-ments in reverse. Keep trunk aligned with legs.    Shift weight from front foot to back foot as item is lifted off shelf.    When leaning forward to pick object up from floor, extend one leg out behind. Keep back straight. Hold onto a sturdy support with other hand.      Sit upright, head facing forward. Try using a roll to support lower back. Keep shoulders relaxed, and avoid rounded back. Keep hips level with knees. Avoid crossing legs for long periods.    Perform all exercises below:  Hold _20___ seconds. Repeat _3___ times.  Do __3__ sessions per day. CAUTION: Movement should be gentle, steady and slow.  Lumbar Rotation: Caudal - Bilateral (Supine)  Feet and knees together, arms outstretched, rotate knees left, turning head in opposite direction, until stretch is felt.      HIP: Hamstrings - Short Sitting   Rest leg on raised surface. Keep knee straight. Lift chest.   Piriformis Stretch, Sitting    Sit, one ankle on opposite knee, same-side hand  on crossed knee. Push down on knee, keeping spine straight. Lean torso forward, with flat back, until tension is felt in hamstrings and gluteals of crossed-leg side.  [facility] [address], [address] [place], [place] [address] Phone # [phone] Fax [phone]

## 2015-08-09 LAB — OB RESULTS CONSOLE GBS: STREP GROUP B AG: NEGATIVE

## 2015-09-02 ENCOUNTER — Inpatient Hospital Stay (HOSPITAL_COMMUNITY): Payer: BLUE CROSS/BLUE SHIELD | Admitting: Anesthesiology

## 2015-09-02 ENCOUNTER — Inpatient Hospital Stay (HOSPITAL_COMMUNITY)
Admission: AD | Admit: 2015-09-02 | Discharge: 2015-09-05 | DRG: 765 | Disposition: A | Discharge status: Home / Self Care | Payer: BLUE CROSS/BLUE SHIELD | Source: Ambulatory Visit | Attending: Obstetrics & Gynecology | Admitting: Obstetrics & Gynecology

## 2015-09-02 ENCOUNTER — Encounter (HOSPITAL_COMMUNITY): Payer: Self-pay

## 2015-09-02 ENCOUNTER — Encounter (HOSPITAL_COMMUNITY)
Admission: AD | Disposition: A | Discharge status: Home / Self Care | Payer: Self-pay | Source: Ambulatory Visit | Attending: Obstetrics & Gynecology

## 2015-09-02 DIAGNOSIS — Z800 Family history of malignant neoplasm of digestive organs: Secondary | ICD-10-CM | POA: Diagnosis not present

## 2015-09-02 DIAGNOSIS — O9081 Anemia of the puerperium: Secondary | ICD-10-CM | POA: Diagnosis not present

## 2015-09-02 DIAGNOSIS — Z3A39 39 weeks gestation of pregnancy: Secondary | ICD-10-CM | POA: Diagnosis not present

## 2015-09-02 DIAGNOSIS — D649 Anemia, unspecified: Secondary | ICD-10-CM | POA: Diagnosis not present

## 2015-09-02 DIAGNOSIS — O99344 Other mental disorders complicating childbirth: Secondary | ICD-10-CM | POA: Diagnosis present

## 2015-09-02 DIAGNOSIS — O3440 Maternal care for other abnormalities of cervix, unspecified trimester: Secondary | ICD-10-CM

## 2015-09-02 DIAGNOSIS — O9962 Diseases of the digestive system complicating childbirth: Secondary | ICD-10-CM | POA: Diagnosis present

## 2015-09-02 DIAGNOSIS — F329 Major depressive disorder, single episode, unspecified: Secondary | ICD-10-CM | POA: Diagnosis present

## 2015-09-02 DIAGNOSIS — A6000 Herpesviral infection of urogenital system, unspecified: Secondary | ICD-10-CM | POA: Diagnosis present

## 2015-09-02 DIAGNOSIS — O9832 Other infections with a predominantly sexual mode of transmission complicating childbirth: Secondary | ICD-10-CM | POA: Diagnosis present

## 2015-09-02 DIAGNOSIS — K219 Gastro-esophageal reflux disease without esophagitis: Secondary | ICD-10-CM | POA: Diagnosis present

## 2015-09-02 DIAGNOSIS — Z9889 Other specified postprocedural states: Secondary | ICD-10-CM

## 2015-09-02 DIAGNOSIS — Z8659 Personal history of other mental and behavioral disorders: Secondary | ICD-10-CM

## 2015-09-02 LAB — CBC
HCT: 31.9 % — ABNORMAL LOW (ref 36.0–46.0)
HCT: 34.8 % — ABNORMAL LOW (ref 36.0–46.0)
HEMOGLOBIN: 11.2 g/dL — AB (ref 12.0–15.0)
Hemoglobin: 10.3 g/dL — ABNORMAL LOW (ref 12.0–15.0)
MCH: 27.3 pg (ref 26.0–34.0)
MCH: 27.7 pg (ref 26.0–34.0)
MCHC: 32.3 g/dL (ref 30.0–36.0)
MCHC: 32.2 g/dL (ref 30.0–36.0)
MCV: 84.6 fL (ref 78.0–100.0)
MCV: 86.1 fL (ref 78.0–100.0)
PLATELETS: 260 10*3/uL (ref 150–400)
PLATELETS: 220 10*3/uL (ref 150–400)
RBC: 3.77 MIL/uL — AB (ref 3.87–5.11)
RBC: 4.04 MIL/uL (ref 3.87–5.11)
RDW: 14.3 % (ref 11.5–15.5)
RDW: 14.3 % (ref 11.5–15.5)
WBC: 8.6 10*3/uL (ref 4.0–10.5)
WBC: 19.3 10*3/uL — AB (ref 4.0–10.5)

## 2015-09-02 LAB — COMPREHENSIVE METABOLIC PANEL
ALK PHOS: 216 U/L — AB (ref 38–126)
ALT: 12 U/L — ABNORMAL LOW (ref 14–54)
ALT: 13 U/L — AB (ref 14–54)
AST: 26 U/L (ref 15–41)
AST: 43 U/L — AB (ref 15–41)
Albumin: 2.7 g/dL — ABNORMAL LOW (ref 3.5–5.0)
Albumin: 2.4 g/dL — ABNORMAL LOW (ref 3.5–5.0)
Alkaline Phosphatase: 220 U/L — ABNORMAL HIGH (ref 38–126)
Anion gap: 6 (ref 5–15)
Anion gap: 6 (ref 5–15)
BUN: 10 mg/dL (ref 6–20)
BUN: 9 mg/dL (ref 6–20)
CALCIUM: 8.8 mg/dL — AB (ref 8.9–10.3)
CALCIUM: 8.4 mg/dL — AB (ref 8.9–10.3)
CHLORIDE: 108 mmol/L (ref 101–111)
CHLORIDE: 111 mmol/L (ref 101–111)
CO2: 21 mmol/L — ABNORMAL LOW (ref 22–32)
CO2: 24 mmol/L (ref 22–32)
CREATININE: 0.82 mg/dL (ref 0.44–1.00)
Creatinine, Ser: 1.13 mg/dL — ABNORMAL HIGH (ref 0.44–1.00)
Glucose, Bld: 89 mg/dL (ref 65–99)
Glucose, Bld: 81 mg/dL (ref 65–99)
Potassium: 4.3 mmol/L (ref 3.5–5.1)
Potassium: 4.4 mmol/L (ref 3.5–5.1)
SODIUM: 141 mmol/L (ref 135–145)
Sodium: 135 mmol/L (ref 135–145)
Total Bilirubin: 0.3 mg/dL (ref 0.3–1.2)
Total Bilirubin: 0.5 mg/dL (ref 0.3–1.2)
Total Protein: 5.9 g/dL — ABNORMAL LOW (ref 6.5–8.1)
Total Protein: 5.4 g/dL — ABNORMAL LOW (ref 6.5–8.1)

## 2015-09-02 LAB — TYPE AND SCREEN
ABO/RH(D): O POS
Antibody Screen: NEGATIVE

## 2015-09-02 LAB — ABO/RH: ABO/RH(D): O POS

## 2015-09-02 LAB — PROTEIN / CREATININE RATIO, URINE
CREATININE, URINE: 152 mg/dL
Creatinine, Urine: <10 mg/dL
Protein Creatinine Ratio: 0.08 mg/mg{Cre} (ref 0.00–0.15)
Total Protein, Urine: 12 mg/dL

## 2015-09-02 LAB — LACTATE DEHYDROGENASE: LDH: 169 U/L (ref 98–192)

## 2015-09-02 LAB — URIC ACID: URIC ACID, SERUM: 5.3 mg/dL (ref 2.3–6.6)

## 2015-09-02 LAB — RPR: RPR: NONREACTIVE

## 2015-09-02 SURGERY — Surgical Case
Anesthesia: Regional

## 2015-09-02 MED ORDER — MORPHINE SULFATE (PF) 0.5 MG/ML IJ SOLN
INTRAMUSCULAR | Status: AC
Start: 2015-09-02 — End: 2015-09-02
  Filled 2015-09-02: qty 10

## 2015-09-02 MED ORDER — OXYTOCIN BOLUS FROM INFUSION: 500.0000 mL | INTRAVENOUS | Status: DC

## 2015-09-02 MED ORDER — FENTANYL 2.5 MCG/ML BUPIVACAINE 1/10 % EPIDURAL INFUSION (WH - ANES)
14.0000 mL/h | INTRAMUSCULAR | Status: DC | PRN
  Administered 2015-09-02 (×2): 14 mL/h via EPIDURAL
  Filled 2015-09-02 (×2): qty 125

## 2015-09-02 MED ORDER — BUTORPHANOL TARTRATE 1 MG/ML IJ SOLN: 1.0000 mg | INTRAMUSCULAR | Status: DC | PRN

## 2015-09-02 MED ORDER — DIPHENHYDRAMINE HCL 50 MG/ML IJ SOLN: 12.5000 mg | INTRAMUSCULAR | Status: DC | PRN

## 2015-09-02 MED ORDER — LACTATED RINGERS IV SOLN: 2.5000 [IU]/h | INTRAVENOUS | Status: DC

## 2015-09-02 MED ORDER — SCOPOLAMINE 1 MG/3DAYS TD PT72
MEDICATED_PATCH | TRANSDERMAL | Status: AC
  Filled 2015-09-02: qty 1

## 2015-09-02 MED ORDER — ROCURONIUM BROMIDE 100 MG/10ML IV SOLN
INTRAVENOUS | Status: AC
  Filled 2015-09-02: qty 1

## 2015-09-02 MED ORDER — KETOROLAC TROMETHAMINE 30 MG/ML IJ SOLN
INTRAMUSCULAR | Status: AC
  Filled 2015-09-02: qty 1

## 2015-09-02 MED ORDER — SODIUM BICARBONATE 8.4 % IV SOLN
INTRAVENOUS | Status: AC
  Filled 2015-09-02: qty 50

## 2015-09-02 MED ORDER — EPHEDRINE 5 MG/ML INJ: 10.0000 mg | INTRAVENOUS | Status: DC | PRN

## 2015-09-02 MED ORDER — LIDOCAINE-EPINEPHRINE (PF) 2 %-1:200000 IJ SOLN
INTRAMUSCULAR | Status: AC
  Filled 2015-09-02: qty 20

## 2015-09-02 MED ORDER — SODIUM BICARBONATE 8.4 % IV SOLN
INTRAVENOUS | Status: DC | PRN
  Administered 2015-09-02: 5 mL via EPIDURAL
  Administered 2015-09-02: 10 mL via EPIDURAL

## 2015-09-02 MED ORDER — LIDOCAINE HCL 1 % IJ SOLN
INTRAMUSCULAR | Status: AC
  Filled 2015-09-02: qty 20

## 2015-09-02 MED ORDER — GLYCOPYRROLATE 0.2 MG/ML IJ SOLN
INTRAMUSCULAR | Status: AC
  Filled 2015-09-02: qty 2

## 2015-09-02 MED ORDER — LIDOCAINE HCL (CARDIAC) 20 MG/ML IV SOLN
INTRAVENOUS | Status: AC
  Filled 2015-09-02: qty 5

## 2015-09-02 MED ORDER — PROPOFOL 10 MG/ML IV BOLUS
INTRAVENOUS | Status: DC | PRN
  Administered 2015-09-02: 160 mg via INTRAVENOUS

## 2015-09-02 MED ORDER — PROPOFOL 10 MG/ML IV BOLUS
INTRAVENOUS | Status: AC
  Filled 2015-09-02: qty 20

## 2015-09-02 MED ORDER — MORPHINE SULFATE (PF) 0.5 MG/ML IJ SOLN
INTRAMUSCULAR | Status: DC | PRN
  Administered 2015-09-02: 3 mg via EPIDURAL

## 2015-09-02 MED ORDER — PHENYLEPHRINE 40 MCG/ML (10ML) SYRINGE FOR IV PUSH (FOR BLOOD PRESSURE SUPPORT): 80.0000 ug | PREFILLED_SYRINGE | INTRAVENOUS | Status: DC | PRN

## 2015-09-02 MED ORDER — NEOSTIGMINE METHYLSULFATE 10 MG/10ML IV SOLN
INTRAVENOUS | Status: AC
  Filled 2015-09-02: qty 1

## 2015-09-02 MED ORDER — LACTATED RINGERS IV SOLN
500.0000 mL | Freq: Once | INTRAVENOUS | Status: AC
  Administered 2015-09-02: 500 mL via INTRAVENOUS

## 2015-09-02 MED ORDER — DEXAMETHASONE SODIUM PHOSPHATE 4 MG/ML IJ SOLN
INTRAMUSCULAR | Status: AC
  Filled 2015-09-02: qty 1

## 2015-09-02 MED ORDER — OXYCODONE-ACETAMINOPHEN 5-325 MG PO TABS: 2.0000 | ORAL_TABLET | ORAL | Status: DC | PRN

## 2015-09-02 MED ORDER — OXYTOCIN 10 UNIT/ML IJ SOLN
INTRAMUSCULAR | Status: AC
Start: 2015-09-02 — End: 2015-09-02
  Filled 2015-09-02: qty 4

## 2015-09-02 MED ORDER — LIDOCAINE HCL (PF) 1 % IJ SOLN
INTRAMUSCULAR | Status: DC | PRN
  Administered 2015-09-02: 5 mL via EPIDURAL
  Administered 2015-09-02: 2 mL via EPIDURAL
  Administered 2015-09-02: 3 mL via EPIDURAL

## 2015-09-02 MED ORDER — CEFAZOLIN SODIUM-DEXTROSE 2-3 GM-% IV SOLR
INTRAVENOUS | Status: DC | PRN
  Administered 2015-09-02: 2 g via INTRAVENOUS

## 2015-09-02 MED ORDER — NALBUPHINE HCL 10 MG/ML IJ SOLN: 5.0000 mg | Freq: Once | INTRAMUSCULAR | Status: DC | PRN

## 2015-09-02 MED ORDER — LIDOCAINE HCL (PF) 1 % IJ SOLN
30.0000 mL | INTRAMUSCULAR | Status: DC | PRN
  Filled 2015-09-02: qty 30

## 2015-09-02 MED ORDER — CEFAZOLIN SODIUM-DEXTROSE 2-4 GM/100ML-% IV SOLN
INTRAVENOUS | Status: AC
  Filled 2015-09-02: qty 100

## 2015-09-02 MED ORDER — METHYLERGONOVINE MALEATE 0.2 MG/ML IJ SOLN
INTRAMUSCULAR | Status: DC | PRN
  Administered 2015-09-02: 0.2 mg via INTRAMUSCULAR

## 2015-09-02 MED ORDER — OXYTOCIN 10 UNIT/ML IJ SOLN
1.0000 m[IU]/min | INTRAVENOUS | Status: DC
  Administered 2015-09-02: 2 m[IU]/min via INTRAVENOUS
  Filled 2015-09-02: qty 4

## 2015-09-02 MED ORDER — OXYCODONE-ACETAMINOPHEN 5-325 MG PO TABS: 1.0000 | ORAL_TABLET | ORAL | Status: DC | PRN

## 2015-09-02 MED ORDER — PHENYLEPHRINE 40 MCG/ML (10ML) SYRINGE FOR IV PUSH (FOR BLOOD PRESSURE SUPPORT)
80.0000 ug | PREFILLED_SYRINGE | INTRAVENOUS | Status: DC | PRN
  Filled 2015-09-02: qty 20

## 2015-09-02 MED ORDER — LACTATED RINGERS IV SOLN
INTRAVENOUS | Status: DC
  Administered 2015-09-02 (×4): via INTRAVENOUS

## 2015-09-02 MED ORDER — MEPERIDINE HCL 25 MG/ML IJ SOLN: 6.2500 mg | INTRAMUSCULAR | Status: DC | PRN

## 2015-09-02 MED ORDER — KETOROLAC TROMETHAMINE 30 MG/ML IJ SOLN
30.0000 mg | Freq: Four times a day (QID) | INTRAMUSCULAR | Status: AC | PRN
  Administered 2015-09-02: 30 mg via INTRAVENOUS

## 2015-09-02 MED ORDER — SODIUM CHLORIDE 0.9 % IR SOLN
Status: DC | PRN
  Administered 2015-09-02: 1000 mL

## 2015-09-02 MED ORDER — DEXAMETHASONE SODIUM PHOSPHATE 4 MG/ML IJ SOLN
INTRAMUSCULAR | Status: DC | PRN
  Administered 2015-09-02: 4 mg via INTRAVENOUS

## 2015-09-02 MED ORDER — FENTANYL CITRATE (PF) 100 MCG/2ML IJ SOLN
INTRAMUSCULAR | Status: DC | PRN
  Administered 2015-09-02: 100 ug via EPIDURAL
  Administered 2015-09-02: 250 ug via INTRAVENOUS

## 2015-09-02 MED ORDER — ONDANSETRON HCL 4 MG/2ML IJ SOLN
INTRAMUSCULAR | Status: AC
  Filled 2015-09-02: qty 2

## 2015-09-02 MED ORDER — MIDAZOLAM HCL 2 MG/2ML IJ SOLN
INTRAMUSCULAR | Status: AC
Start: 2015-09-02 — End: 2015-09-02
  Filled 2015-09-02: qty 2

## 2015-09-02 MED ORDER — TERBUTALINE SULFATE 1 MG/ML IJ SOLN: 0.2500 mg | Freq: Once | INTRAMUSCULAR | Status: DC | PRN

## 2015-09-02 MED ORDER — KETOROLAC TROMETHAMINE 30 MG/ML IJ SOLN: 30.0000 mg | Freq: Four times a day (QID) | INTRAMUSCULAR | Status: AC | PRN

## 2015-09-02 MED ORDER — HYDROMORPHONE HCL 1 MG/ML IJ SOLN: 0.2500 mg | INTRAMUSCULAR | Status: DC | PRN

## 2015-09-02 MED ORDER — ONDANSETRON HCL 4 MG/2ML IJ SOLN
4.0000 mg | Freq: Four times a day (QID) | INTRAMUSCULAR | Status: DC | PRN
  Administered 2015-09-02 (×2): 4 mg via INTRAVENOUS
  Filled 2015-09-02: qty 2

## 2015-09-02 MED ORDER — SUCCINYLCHOLINE 20MG/ML (10ML) SYRINGE FOR MEDFUSION PUMP - OPTIME
INTRAMUSCULAR | Status: DC | PRN
  Administered 2015-09-02: 120 mg via INTRAVENOUS

## 2015-09-02 MED ORDER — FENTANYL CITRATE (PF) 250 MCG/5ML IJ SOLN
INTRAMUSCULAR | Status: AC
  Filled 2015-09-02: qty 5

## 2015-09-02 MED ORDER — FENTANYL CITRATE (PF) 100 MCG/2ML IJ SOLN
INTRAMUSCULAR | Status: AC
  Filled 2015-09-02: qty 2

## 2015-09-02 MED ORDER — CITRIC ACID-SODIUM CITRATE 334-500 MG/5ML PO SOLN
30.0000 mL | ORAL | Status: DC | PRN
  Administered 2015-09-02 (×2): 30 mL via ORAL
  Filled 2015-09-02 (×2): qty 15

## 2015-09-02 MED ORDER — TERBUTALINE SULFATE 1 MG/ML IJ SOLN
INTRAMUSCULAR | Status: DC
Start: 2015-09-02 — End: 2015-09-02
  Filled 2015-09-02: qty 1

## 2015-09-02 MED ORDER — MIDAZOLAM HCL 5 MG/5ML IJ SOLN
INTRAMUSCULAR | Status: DC | PRN
  Administered 2015-09-02: 2 mg via INTRAVENOUS

## 2015-09-02 MED ORDER — ACETAMINOPHEN 325 MG PO TABS: 650.0000 mg | ORAL_TABLET | ORAL | Status: DC | PRN

## 2015-09-02 MED ORDER — OXYTOCIN 10 UNIT/ML IJ SOLN
40.0000 [IU] | INTRAVENOUS | Status: DC | PRN
  Administered 2015-09-02: 40 [IU] via INTRAVENOUS

## 2015-09-02 MED ORDER — FLEET ENEMA 7-19 GM/118ML RE ENEM: 1.0000 | ENEMA | RECTAL | Status: DC | PRN

## 2015-09-02 MED ORDER — LACTATED RINGERS IV SOLN: 500.0000 mL | INTRAVENOUS | Status: DC | PRN

## 2015-09-02 MED ORDER — VALACYCLOVIR HCL 500 MG PO TABS
500.0000 mg | ORAL_TABLET | Freq: Two times a day (BID) | ORAL | Status: DC
  Administered 2015-09-02 – 2015-09-05 (×6): 500 mg via ORAL
  Filled 2015-09-02 (×8): qty 1

## 2015-09-02 SURGICAL SUPPLY — 41 items
APL SKNCLS STERI-STRIP NONHPOA (GAUZE/BANDAGES/DRESSINGS)
BARRIER ADHS 3X4 INTERCEED (GAUZE/BANDAGES/DRESSINGS) ×3 IMPLANT
BENZOIN TINCTURE PRP APPL 2/3 (GAUZE/BANDAGES/DRESSINGS) IMPLANT
BRR ADH 4X3 ABS CNTRL BYND (GAUZE/BANDAGES/DRESSINGS) ×1
CHLORAPREP W/TINT 26ML (MISCELLANEOUS) ×3 IMPLANT
CLAMP CORD UMBIL (MISCELLANEOUS) IMPLANT
CLOSURE WOUND 1/2 X4 (GAUZE/BANDAGES/DRESSINGS)
CLOTH BEACON ORANGE TIMEOUT ST (SAFETY) ×3 IMPLANT
DRSG OPSITE POSTOP 4X10 (GAUZE/BANDAGES/DRESSINGS) ×3 IMPLANT
ELECT REM PT RETURN 9FT ADLT (ELECTROSURGICAL) ×3
ELECTRODE REM PT RTRN 9FT ADLT (ELECTROSURGICAL) ×1 IMPLANT
EXTRACTOR VACUUM BELL STYLE (SUCTIONS) ×2 IMPLANT
GLOVE BIO SURGEON STRL SZ7 (GLOVE) ×5 IMPLANT
GLOVE BIOGEL PI IND STRL 7.0 (GLOVE) ×3 IMPLANT
GLOVE BIOGEL PI INDICATOR 7.0 (GLOVE) ×8
GOWN STRL REUS W/TWL LRG LVL3 (GOWN DISPOSABLE) ×9 IMPLANT
KIT ABG SYR 3ML LUER SLIP (SYRINGE) ×2 IMPLANT
LIQUID BAND (GAUZE/BANDAGES/DRESSINGS) ×2 IMPLANT
NDL HYPO 25X5/8 SAFETYGLIDE (NEEDLE) IMPLANT
NEEDLE HYPO 25X5/8 SAFETYGLIDE (NEEDLE) ×3 IMPLANT
NS IRRIG 1000ML POUR BTL (IV SOLUTION) ×3 IMPLANT
PACK C SECTION WH (CUSTOM PROCEDURE TRAY) ×3 IMPLANT
PAD ABD 7.5X8 STRL (GAUZE/BANDAGES/DRESSINGS) ×2 IMPLANT
PAD OB MATERNITY 4.3X12.25 (PERSONAL CARE ITEMS) ×3 IMPLANT
PENCIL SMOKE EVAC W/HOLSTER (ELECTROSURGICAL) ×3 IMPLANT
RTRCTR C-SECT PINK 25CM LRG (MISCELLANEOUS) ×3 IMPLANT
SPONGE GAUZE 4X4 12PLY STER LF (GAUZE/BANDAGES/DRESSINGS) ×4 IMPLANT
SPONGE LAP 18X18 X RAY DECT (DISPOSABLE) ×2 IMPLANT
STRIP CLOSURE SKIN 1/2X4 (GAUZE/BANDAGES/DRESSINGS) IMPLANT
SUT CHROMIC 0 CTX 36 (SUTURE) IMPLANT
SUT MON AB 4-0 PS1 27 (SUTURE) ×3 IMPLANT
SUT PLAIN 0 NONE (SUTURE) IMPLANT
SUT PLAIN 2 0 (SUTURE) ×3
SUT PLAIN 2 0 XLH (SUTURE) ×1 IMPLANT
SUT PLAIN ABS 2-0 CT1 27XMFL (SUTURE) IMPLANT
SUT VIC AB 0 CTX 36 (SUTURE) ×15
SUT VIC AB 0 CTX36XBRD ANBCTRL (SUTURE) ×5 IMPLANT
SUT VIC AB 2-0 CT1 27 (SUTURE) ×3
SUT VIC AB 2-0 CT1 TAPERPNT 27 (SUTURE) ×1 IMPLANT
TOWEL OR 17X24 6PK STRL BLUE (TOWEL DISPOSABLE) ×3 IMPLANT
TRAY FOLEY CATH SILVER 14FR (SET/KITS/TRAYS/PACK) IMPLANT

## 2015-09-02 NOTE — Anesthesia Postprocedure Evaluation (Signed)
Anesthesia Post Note  Patient: Robin Palmer  Procedure(s) Performed: Procedure(s) (LRB): CESAREAN SECTION (N/A)  Patient location during evaluation: PACU Anesthesia Type: General Level of consciousness: sedated and patient cooperative Pain management: pain level controlled Vital Signs Assessment: post-procedure vital signs reviewed and stable Respiratory status: spontaneous breathing Cardiovascular status: stable Postop Assessment: epidural receding Anesthetic complications: no    Last Vitals:  Filed Vitals:   09/02/15 2100 09/02/15 2115  BP:  132/98  Pulse:  76  Temp:    Resp: 16 16    Last Pain:  Filed Vitals:   09/02/15 2123  PainSc: 1     LLE Motor Response: Purposeful movement (09/02/15 2115) LLE Sensation: Decreased (09/02/15 2115) RLE Motor Response: Purposeful movement (09/02/15 2115) RLE Sensation: Decreased (09/02/15 2115)      Lewie LoronJohn Savian Mazon

## 2015-09-02 NOTE — Anesthesia Preprocedure Evaluation (Signed)
Anesthesia Evaluation  Patient identified by MRN, date of birth, ID band Patient awake    Reviewed: Allergy & Precautions, NPO status , Patient's Chart, lab work & pertinent test results  Airway Mallampati: I  TM Distance: >3 FB Neck ROM: Full    Dental  (+) Teeth Intact, Dental Advisory Given   Pulmonary neg pulmonary ROS,    Pulmonary exam normal breath sounds clear to auscultation       Cardiovascular Exercise Tolerance: Good negative cardio ROS Normal cardiovascular exam Rhythm:Regular Rate:Normal     Neuro/Psych negative neurological ROS  negative psych ROS   GI/Hepatic Neg liver ROS, GERD  Medicated,  Endo/Other  negative endocrine ROS  Renal/GU negative Renal ROS     Musculoskeletal negative musculoskeletal ROS (+)   Abdominal   Peds  Hematology  (+) Blood dyscrasia, anemia , Plt 260k    Anesthesia Other Findings Day of surgery medications reviewed with the patient.  Reproductive/Obstetrics (+) Pregnancy                             Anesthesia Physical Anesthesia Plan  ASA: II  Anesthesia Plan: Epidural   Post-op Pain Management:    Induction:   Airway Management Planned:   Additional Equipment:   Intra-op Plan:   Post-operative Plan:   Informed Consent: I have reviewed the patients History and Physical, chart, labs and discussed the procedure including the risks, benefits and alternatives for the proposed anesthesia with the patient or authorized representative who has indicated his/her understanding and acceptance.   Dental advisory given  Plan Discussed with:   Anesthesia Plan Comments: (Patient identified. Risks/Benefits/Options discussed with patient including but not limited to bleeding, infection, nerve damage, paralysis, failed block, incomplete pain control, headache, blood pressure changes, nausea, vomiting, reactions to medication both or allergic,  itching and postpartum back pain. Confirmed with bedside nurse the patient's most recent platelet count. Confirmed with patient that they are not currently taking any anticoagulation, have any bleeding history or any family history of bleeding disorders. Patient expressed understanding and wished to proceed. All questions were answered. )        Anesthesia Quick Evaluation

## 2015-09-02 NOTE — Anesthesia Procedure Notes (Addendum)
Epidural Patient location during procedure: OB  Staffing Anesthesiologist: Cecile HearingURK, STEPHEN EDWARD Performed by: anesthesiologist   Preanesthetic Checklist Completed: patient identified, pre-op evaluation, timeout performed, IV checked, risks and benefits discussed and monitors and equipment checked  Epidural Patient position: sitting Prep: DuraPrep Patient monitoring: blood pressure and continuous pulse ox Approach: midline Location: L3-L4 Injection technique: LOR air  Needle:  Needle type: Tuohy  Needle gauge: 17 G Needle length: 9 cm Needle insertion depth: 4 cm Catheter size: 19 Gauge Catheter at skin depth: 9 cm Test dose: negative and Other (1% Lidocaine)  Additional Notes Patient identified.  Risk benefits discussed including failed block, incomplete pain control, headache, nerve damage, paralysis, blood pressure changes, nausea, vomiting, reactions to medication both toxic or allergic, and postpartum back pain.  Patient expressed understanding and wished to proceed.  All questions were answered.  Sterile technique used throughout procedure and epidural site dressed with sterile barrier dressing. No paresthesia or other complications noted. The patient did not experience any signs of intravascular injection such as tinnitus or metallic taste in mouth nor signs of intrathecal spread such as rapid motor block. Please see nursing notes for vital signs. Reason for block:procedure for pain  Procedure Name: Intubation Performed by: Chrisoula Zegarra G Pre-anesthesia Checklist: Patient identified, Emergency Drugs available, Suction available, Patient being monitored and Timeout performed Patient Re-evaluated:Patient Re-evaluated prior to inductionOxygen Delivery Method: Circle system utilized Preoxygenation: Pre-oxygenation with 100% oxygen Intubation Type: IV induction, Rapid sequence and Cricoid Pressure applied Laryngoscope Size: Glidescope and 3 Grade View: Grade I Tube type:  Oral Tube size: 7.0 mm Number of attempts: 1 Airway Equipment and Method: Video-laryngoscopy Placement Confirmation: ETT inserted through vocal cords under direct vision,  positive ETCO2 and breath sounds checked- equal and bilateral Secured at: 20 cm Tube secured with: Tape Dental Injury: Teeth and Oropharynx as per pre-operative assessment  Comments: Intubation by Renold DonGermeroth

## 2015-09-02 NOTE — Consult Note (Signed)
Neonatology Note:   Attendance at C-section:    I was asked by Dr. Dion BodyVarnado to attend this stat C/S at term for NRFHT. The mother is a G1, GBS negative. ROM at 1048am, fluid thin meconium. No signs of chorio. Infant vigorous with good spontaneous cry and tone and color. HR >100.  Needed only minimal bulb suctioning. Ap 8/9.  Sao2 placed and HR 178 with SaO2 98% at 8 minutes of life. Lungs clear to ausc in DR. To CN to care of Pediatrician. Support lactation.    Jamie Brookesavid Ehrmann, MD

## 2015-09-02 NOTE — Progress Notes (Signed)
In to assess pt. Pt had right sided abdominal pain that has now resolved with bolus. VSS Gen:  NAD, A&O x3 Cervix 6/80/-1, thicker on pt's left side Adequate pelvis. Cat 1 tracing.  Contractions q2-3 min. A/P IUP @ 39 6/7 weeks. S/p SROM. Augmentation of labor with Pitocin-Progressing well Epidural anesthesia. GBS negative. Cont current management.

## 2015-09-02 NOTE — Brief Op Note (Signed)
09/02/2015  8:37 PM  PATIENT:  Robin Palmer  30 y.o. female  PRE-OPERATIVE DIAGNOSIS:  stat c/s, NRFHR  POST-OPERATIVE DIAGNOSIS:  stat c/s, NRFHR  PROCEDURE:  Procedure(s): CESAREAN SECTION (N/A), Primary LTCS, emergent  SURGEON:  Surgeon(s) and Role:    * Geryl RankinsEvelyn Kennya Schwenn, MD - Primary  PHYSICIAN ASSISTANT:   ASSISTANTS: Gerrit HeckJessica Emly, CNM, Technician   ANESTHESIA:   epidural and general  EBL:  Total I/O In: 2900 [I.V.:2900] Out: 900 [Urine:200; Blood:700]  BLOOD ADMINISTERED:none  DRAINS: Urinary Catheter (Foley)   LOCAL MEDICATIONS USED:  NONE  SPECIMEN:  Source of Specimen:  Placenta  DISPOSITION OF SPECIMEN:  PATHOLOGY  COUNTS:  YES  TOURNIQUET:  * No tourniquets in log *5  DICTATION: .Other Dictation: Dictation Number [id number]  PLAN OF CARE: Transfer to Postpartum after PACU  PATIENT DISPOSITION:  PACU - hemodynamically stable.   Delay start of Pharmacological VTE agent (>24hrs) due to surgical blood loss or risk of bleeding: yes

## 2015-09-02 NOTE — Progress Notes (Addendum)
Subjective: Postpartum Day 0: Cesarean Delivery Patient reports mild abdominal discomfort.  She denies headaches, scotomata, blurry vision, chest pain or shortness of breath.  Breastfeeding.  Small vaginal bleeding.   Objective: Vital signs in last 24 hours: Temp:  [97.4 F (36.3 C)-98.8 F (37.1 C)] 97.5 F (36.4 C) (04/07 2145) Pulse Rate:  [60-104] 72 (04/07 2230) Resp:  [11-20] 15 (04/07 2230) BP: (96-156)/(58-108) 147/88 mmHg (04/07 2215) SpO2:  [96 %-100 %] 99 % (04/07 2230) Weight:  [64.411 kg (142 lb)] 64.411 kg (142 lb) (04/07 0439)  Physical Exam:  General: alert and cooperative  CVS: s1, s2, RRR Pulm: CTAB Abdomen: Soft, appropriately tender, uterus fundus firm Lochia: appropriate Incision: Clean, dry, intact.  DVT Evaluation: No evidence of DVT seen on physical exam. Extremity: warm and well perfused, no edema, no calf tenderness. 1+ patellar reflex.    CBC    Component Value Date/Time   WBC 19.3* 09/02/2015 2224   RBC 4.04 09/02/2015 2224   HGB 11.2* 09/02/2015 2224   HCT 34.8* 09/02/2015 2224   PLT 220 09/02/2015 2224   MCV 86.1 09/02/2015 2224   MCH 27.7 09/02/2015 2224   MCHC 32.2 09/02/2015 2224   RDW 14.3 09/02/2015 2224   CMP     Component Value Date/Time   NA 141 09/02/2015 2224   K 4.4 09/02/2015 2224   CL 111 09/02/2015 2224   CO2 24 09/02/2015 2224   GLUCOSE 81 09/02/2015 2224   BUN 9 09/02/2015 2224   CREATININE 1.13* 09/02/2015 2224   CALCIUM 8.4* 09/02/2015 2224   PROT 5.4* 09/02/2015 2224   ALBUMIN 2.4* 09/02/2015 2224   AST 43* 09/02/2015 2224   ALT 13* 09/02/2015 2224   ALKPHOS 216* 09/02/2015 2224   BILITOT 0.5 09/02/2015 2224   GFRNONAA >60 09/02/2015 2224   GFRAA >60 09/02/2015 2224      Assessment/Plan: Status post Stat Cesarean section for abnormal fetal heart tracing, with elevated Bps,   - PIH labs drawn, urine spot  Protein cr ratio pending - Continue with close BP and preeclampsia symptoms monitoring,  For IV  antihypertensives if severe range BPs.  -Consider Magnesium sulfate for postpartum seizure prophylaxis if worsening Bps or preeeclampsia symptoms or highly abnormal labs.     [facility] HospitalKULWA,Phillp Dolores [facility] Center[facility]AKURU 09/02/2015, 10:47 PM   Reviewed normal spot protein creatinine ratio. Bps are mostly normal.  Will continue with close observation, defer magnesium sulfate for now.  Filed Vitals:   09/03/15 0015 09/03/15 0130 09/03/15 0230 09/03/15 0330  BP: 130/82 131/92 132/82 121/79  Pulse: 72 70 64 62  Temp: 97.6 F (36.4 C) 97.8 F (36.6 C) 97.9 F (36.6 C) 97.6 F (36.4 C)  TempSrc:  Oral Oral Oral  Resp: 20 19 18 16   Height:      Weight:      SpO2: 97% 96% 97% 97%   Dr. Sallye OberKulwa.

## 2015-09-02 NOTE — H&P (Signed)
Robin Palmer is a 30 y.o. female G1P0 @ 39.6 wks estimated gestational age (as dated by LMP c/w 20 week ultrasound) presents complaining of painful contractions that started yesterday while at work. [place]. Pt has been followed at [facility] (Dr. Charlotta Newtonzan).Denies recent outbreak/prodrome.  Maternal Medical History:  Reason for admission: Contractions.   Contractions: Onset was yesterday.   Frequency: regular.   Perceived severity is strong.    Fetal activity: Perceived fetal activity is normal.   Last perceived fetal movement was within the past hour.    Prenatal complications: No bleeding.   Prenatal Complications - Diabetes: none.   Pregnancy c/b: 1) H/O LEEP 2) HSV-2 3) H/O depression 4) GERD  U/S: 1) CL @ 16 wks due to h/o LEEP = 5.2 cm. 2) 20-wk: Breech. Posterior placenta. Normal anatomy. 3) 34.5 wks: Growth (5# 9 oz -- 64th%tile)  TWG 28 lbs.   OB History    Gravida Para Term Preterm AB TAB SAB Ectopic Multiple Living   1              Past Medical History  Diagnosis Date  . Medical history non-contributory    Past Surgical History  Procedure Laterality Date  . No past surgeries     Family History: family history is not on file.Father had colon CA. Social History:  reports that she has never smoked. She does not have any smokeless tobacco history on file. She reports that she does not drink alcohol or use illicit drugs. Pt is single & Caucasian. FOB is [employer]. Pt is a Vet.   Prenatal Transfer Tool  Maternal Diabetes: No Genetic Screening: Panorama low risk female, AFP neg Maternal Ultrasounds/Referrals: Normal Fetal Ultrasounds or other Referrals:  None Maternal Substance Abuse:  No Significant Maternal Medications:  Meds include: Nexium Other: Valtrex Significant Maternal Lab Results:  Lab values include: Group B Strep negative Other Comments:  Pap neg 07/2014. Treated for UTI on 04/04/15; TOC neg. Treated for yeast vaginitis. Hbg 11.7  at 28-wks. Received Tdap on 06/30/15.  Review of Systems  Constitutional: Negative for fever and chills.  Eyes: Negative for blurred vision, double vision and photophobia.  Cardiovascular: Negative for chest pain.  Gastrointestinal: Negative for vomiting and abdominal pain.  Denies h/a, epigastric pain or difficulty breathing.   Dilation: 3 Effacement (%): 90 Station: -2 Exam by:: Janeth Rasehristina Robinson, RN Blood pressure 129/89, pulse 81, temperature 97.4 F (36.3 C), temperature source Oral, resp. rate 18, last menstrual period 11/27/2014. Maternal Exam:  Uterine Assessment: Contraction strength is moderate.  Contraction duration is 60 seconds. Contraction frequency is regular.   Abdomen: Patient reports no abdominal tenderness. Fundal height is CWD.   Estimated fetal weight is 7 lbs.   Fetal presentation: vertex  Introitus: Normal vulva. Vulva is negative for lesion.  Normal vagina.  Vagina is negative for ulcerations.  Pelvis: adequate for delivery.   Cervix: Cervix evaluated by digital exam.     Fetal Exam Fetal Monitor Review: Mode: fetoscope.   Variability: moderate (6-25 bpm).   Pattern: accelerations present and no decelerations.    Fetal State Assessment: Category I - tracings are normal.     Physical Exam  Constitutional: She is oriented to person, place, and time. She appears well-developed and well-nourished. No distress.  HENT:  Head: Normocephalic and atraumatic.  Neck: Normal range of motion. Neck supple.  Cardiovascular: Normal rate and regular rhythm.  Exam reveals no gallop and no friction rub.   No murmur  heard. Respiratory: Effort normal and breath sounds normal.  GI: Soft. There is no tenderness. There is no rebound and no guarding.  Genitourinary: Vagina normal. Vulva exhibits no lesion.  Musculoskeletal: Normal range of motion. She exhibits no edema.  Neurological: She is alert and oriented to person, place, and time.  Skin: Skin is warm and dry.   Psychiatric: She has a normal mood and affect.   No lesions noted to external genitalia, vagina, cvx or rectum     Prenatal labs: ABO, Rh: O positive Antibody: Neg Rubella: Immune RPR: NR HBsAg: Neg HIV: Neg GBS: Neg  Results for orders placed or performed during the hospital encounter of 09/02/15 (from the past 24 hour(s))  CBC     Status: Abnormal   Collection Time: 09/02/15  4:16 AM  Result Value Ref Range   WBC 8.6 4.0 - 10.5 K/uL   RBC 3.77 (L) 3.87 - 5.11 MIL/uL   Hemoglobin 10.3 (L) 12.0 - 15.0 g/dL   HCT 72.5 (L) 36.6 - 44.0 %   MCV 84.6 78.0 - 100.0 fL   MCH 27.3 26.0 - 34.0 pg   MCHC 32.3 30.0 - 36.0 g/dL   RDW 34.7 42.5 - 95.6 %   Platelets 260 150 - 400 K/uL  Type and screen [facility]     Status: None   Collection Time: 09/02/15  4:16 AM  Result Value Ref Range   ABO/RH(D) O POS    Antibody Screen NEG    Sample Expiration 09/05/2015   Comprehensive metabolic panel     Status: Abnormal   Collection Time: 09/02/15  4:16 AM  Result Value Ref Range   Sodium 135 135 - 145 mmol/L   Potassium 4.3 3.5 - 5.1 mmol/L   Chloride 108 101 - 111 mmol/L   CO2 21 (L) 22 - 32 mmol/L   Glucose, Bld 89 65 - 99 mg/dL   BUN 10 6 - 20 mg/dL   Creatinine, Ser 3.87 0.44 - 1.00 mg/dL   Calcium 8.8 (L) 8.9 - 10.3 mg/dL   Total Protein 5.9 (L) 6.5 - 8.1 g/dL   Albumin 2.7 (L) 3.5 - 5.0 g/dL   AST 26 15 - 41 U/L   ALT 12 (L) 14 - 54 U/L   Alkaline Phosphatase 220 (H) 38 - 126 U/L   Total Bilirubin 0.3 0.3 - 1.2 mg/dL   GFR calc non Af Amer >60 >60 mL/min   GFR calc Af Amer >60 >60 mL/min   Anion gap 6 5 - 15  Lactate dehydrogenase     Status: None   Collection Time: 09/02/15  4:16 AM  Result Value Ref Range   LDH 169 98 - 192 U/L  Uric acid     Status: None   Collection Time: 09/02/15  4:16 AM  Result Value Ref Range   Uric Acid, Serum 5.3 2.3 - 6.6 mg/dL    Assessment/Plan: -Admit to Birthing Suite -FWB- Cat. I -Latent labor -HSV-2 -H/O  depression (no meds) -H/O LEEP -GERD -Labor: will monitor contractions, if less than q3-6min, will start Pit per protocol -Pain: epidural upon request -CBC, T&S to be collected -Elevated BP noted, suspect due to pain, repeat BP normal. Collect preE labs w/ admission labs.Pt currently asymptomatic. -GBS negative -Consider social work consult post delivery   Sherre Scarlet 09/02/2015, 4:05 AM

## 2015-09-02 NOTE — Progress Notes (Signed)
Robin Palmer is a 30 y.o. G1P0 at 9582w6d   Subjective: Pt comfortable s/p epidural.  Objective: BP 115/85 mmHg  Pulse 74  Temp(Src) 97.9 F (36.6 C) (Oral)  Resp 18  Ht 5\' 3"  (1.6 m)  Wt 64.411 kg (142 lb)  BMI 25.16 kg/m2  LMP 11/27/2014 I/O last 3 completed shifts: In: -  Out: 50 [Urine:50]    FHT:  FHR: 140s bpm, variability: moderate,  accelerations:  Present,  decelerations:  Present mild variables UC:   regular, every 2-4 minutes SVE:   Dilation: 3 Effacement (%): 100 Station: -2 Exam by:: h stone rnc Pelvic exam deferred. Leopolds 7-7.5 pounds  Labs: Lab Results  Component Value Date   WBC 8.6 09/02/2015   HGB 10.3* 09/02/2015   HCT 31.9* 09/02/2015   MCV 84.6 09/02/2015   PLT 260 09/02/2015    Assessment / Plan: Augmentation of labor, progressing well  Labor: Progressing on Pitocin, will continue to increase then AROM Preeclampsia:  No s/sxs of preeclampsia Fetal Wellbeing:  Category II Pain Control:  Epidural I/D:  n/a Anticipated MOD:  NSVD, if pelvis adequate  Tomiko Schoon 09/02/2015, 7:53 AM

## 2015-09-02 NOTE — MAU Note (Signed)
Pt presents complaining of contractions since noon yesterday. Denies leaking or bleeding. Reports good fetal movement. 1cm in office on tuesday

## 2015-09-02 NOTE — Progress Notes (Signed)
[name] is a [age] y.o. G1P0 at 4462w6d   Subjective: Pt with c/o nausea.  Denies feeling pressure.  Objective: BP 127/90 mmHg  Pulse 77  Temp(Src) 98.4 F (36.9 C) (Oral)  Resp 17  Ht 5\' 3"  (1.6 m)  Wt 64.411 kg (142 lb)  BMI 25.16 kg/m2  LMP 11/27/2014 I/O last 3 completed shifts: In: -  Out: 50 [Urine:50] Total I/O In: -  Out: 900 [Urine:900]  FHT:  FHR: 150s bpm, variability: moderate,  accelerations:  Present,  decelerations:  Absent UC:   q 2 min SVE:   Dilation: 8.5 Effacement (%): 100 Station: -1 Exam by:: dr Dion Bodyvarnado  Labs: Lab Results  Component Value Date   WBC 8.6 09/02/2015   HGB 10.3* 09/02/2015   HCT 31.9* 09/02/2015   MCV 84.6 09/02/2015   PLT 260 09/02/2015    Assessment / Plan: Augmentation of labor, progressing well-pt with no cervical change x 4 hours.  Suspect baby has rotated and descended rapidly.    Labor: Progressing well. Preeclampsia:  n/a Fetal Wellbeing:  Category I Pain Control:  Labor support without medications and Epidural I/D:  SROM @ 1048, light meconium. No signs of chorioamnionitis. Anticipated MOD:  NSVD  Robin Palmer 09/02/2015, 6:11 PM

## 2015-09-02 NOTE — Transfer of Care (Signed)
Immediate Anesthesia Transfer of Care Note  Patient: Robin Palmer  Procedure(s) Performed: Procedure(s): CESAREAN SECTION (N/A)  Patient Location: PACU  Anesthesia Type:General and Epidural  Level of Consciousness: awake, alert  and oriented  Airway & Oxygen Therapy: Patient Spontanous Breathing and Patient connected to nasal cannula oxygen  Post-op Assessment: Report given to RN and Post -op Vital signs reviewed and stable  Post vital signs: Reviewed and stable  Last Vitals:  Filed Vitals:   09/02/15 1830 09/02/15 1900  BP: 125/92 120/72  Pulse: 85 82  Temp:    Resp: 18 18    Complications: No apparent anesthesia complications

## 2015-09-03 LAB — CBC
HCT: 31.6 % — ABNORMAL LOW (ref 36.0–46.0)
Hemoglobin: 10.1 g/dL — ABNORMAL LOW (ref 12.0–15.0)
MCH: 27.7 pg (ref 26.0–34.0)
MCHC: 32 g/dL (ref 30.0–36.0)
MCV: 86.8 fL (ref 78.0–100.0)
PLATELETS: 211 10*3/uL (ref 150–400)
RBC: 3.64 MIL/uL — AB (ref 3.87–5.11)
RDW: 14.4 % (ref 11.5–15.5)
WBC: 20.5 10*3/uL — ABNORMAL HIGH (ref 4.0–10.5)

## 2015-09-03 MED ORDER — NALBUPHINE HCL 10 MG/ML IJ SOLN: 5.0000 mg | INTRAMUSCULAR | Status: DC | PRN

## 2015-09-03 MED ORDER — ONDANSETRON HCL 4 MG/2ML IJ SOLN: 4.0000 mg | Freq: Three times a day (TID) | INTRAMUSCULAR | Status: DC | PRN

## 2015-09-03 MED ORDER — LANOLIN HYDROUS EX OINT: 1.0000 "application " | TOPICAL_OINTMENT | CUTANEOUS | Status: DC | PRN

## 2015-09-03 MED ORDER — DIPHENHYDRAMINE HCL 25 MG PO CAPS: 25.0000 mg | ORAL_CAPSULE | Freq: Four times a day (QID) | ORAL | Status: DC | PRN

## 2015-09-03 MED ORDER — MEASLES, MUMPS & RUBELLA VAC ~~LOC~~ INJ
0.5000 mL | INJECTION | Freq: Once | SUBCUTANEOUS | Status: DC
  Filled 2015-09-03: qty 0.5

## 2015-09-03 MED ORDER — WITCH HAZEL-GLYCERIN EX PADS: 1.0000 "application " | MEDICATED_PAD | CUTANEOUS | Status: DC | PRN

## 2015-09-03 MED ORDER — IBUPROFEN 600 MG PO TABS
600.0000 mg | ORAL_TABLET | Freq: Four times a day (QID) | ORAL | Status: DC
  Administered 2015-09-03 – 2015-09-05 (×10): 600 mg via ORAL
  Filled 2015-09-03 (×9): qty 1

## 2015-09-03 MED ORDER — SIMETHICONE 80 MG PO CHEW
80.0000 mg | CHEWABLE_TABLET | Freq: Three times a day (TID) | ORAL | Status: DC
  Administered 2015-09-03 – 2015-09-05 (×7): 80 mg via ORAL
  Filled 2015-09-03 (×7): qty 1

## 2015-09-03 MED ORDER — NALOXONE HCL 2 MG/2ML IJ SOSY
1.0000 ug/kg/h | PREFILLED_SYRINGE | INTRAVENOUS | Status: DC | PRN
  Filled 2015-09-03: qty 2

## 2015-09-03 MED ORDER — OXYCODONE HCL 5 MG PO TABS
5.0000 mg | ORAL_TABLET | ORAL | Status: DC | PRN
  Administered 2015-09-03: 5 mg via ORAL
  Filled 2015-09-03 (×2): qty 1

## 2015-09-03 MED ORDER — SODIUM CHLORIDE 0.9% FLUSH: 3.0000 mL | INTRAVENOUS | Status: DC | PRN

## 2015-09-03 MED ORDER — OXYTOCIN 10 UNIT/ML IJ SOLN: 2.5000 [IU]/h | INTRAVENOUS | Status: AC

## 2015-09-03 MED ORDER — DIBUCAINE 1 % RE OINT: 1.0000 "application " | TOPICAL_OINTMENT | RECTAL | Status: DC | PRN

## 2015-09-03 MED ORDER — FERROUS SULFATE 325 (65 FE) MG PO TABS
325.0000 mg | ORAL_TABLET | Freq: Two times a day (BID) | ORAL | Status: DC
  Administered 2015-09-03 – 2015-09-05 (×4): 325 mg via ORAL
  Filled 2015-09-03 (×4): qty 1

## 2015-09-03 MED ORDER — PRENATAL MULTIVITAMIN CH
1.0000 | ORAL_TABLET | Freq: Every day | ORAL | Status: DC
  Administered 2015-09-03 – 2015-09-05 (×3): 1 via ORAL
  Filled 2015-09-03 (×3): qty 1

## 2015-09-03 MED ORDER — SCOPOLAMINE 1 MG/3DAYS TD PT72
1.0000 | MEDICATED_PATCH | Freq: Once | TRANSDERMAL | Status: DC
  Filled 2015-09-03: qty 1

## 2015-09-03 MED ORDER — SENNOSIDES-DOCUSATE SODIUM 8.6-50 MG PO TABS
2.0000 | ORAL_TABLET | ORAL | Status: DC
  Administered 2015-09-04 – 2015-09-05 (×2): 2 via ORAL
  Filled 2015-09-03 (×2): qty 2

## 2015-09-03 MED ORDER — SIMETHICONE 80 MG PO CHEW
80.0000 mg | CHEWABLE_TABLET | ORAL | Status: DC | PRN
  Administered 2015-09-05: 80 mg via ORAL

## 2015-09-03 MED ORDER — NALOXONE HCL 0.4 MG/ML IJ SOLN: 0.4000 mg | INTRAMUSCULAR | Status: DC | PRN

## 2015-09-03 MED ORDER — OXYCODONE HCL 5 MG PO TABS
10.0000 mg | ORAL_TABLET | ORAL | Status: DC | PRN
  Administered 2015-09-04 – 2015-09-05 (×4): 10 mg via ORAL
  Filled 2015-09-03 (×4): qty 2

## 2015-09-03 MED ORDER — METHYLERGONOVINE MALEATE 0.2 MG PO TABS: 0.2000 mg | ORAL_TABLET | ORAL | Status: DC | PRN

## 2015-09-03 MED ORDER — METHYLERGONOVINE MALEATE 0.2 MG/ML IJ SOLN: 0.2000 mg | INTRAMUSCULAR | Status: DC | PRN

## 2015-09-03 MED ORDER — LACTATED RINGERS IV SOLN
INTRAVENOUS | Status: DC
  Administered 2015-09-03: 05:00:00 via INTRAVENOUS

## 2015-09-03 MED ORDER — SIMETHICONE 80 MG PO CHEW
80.0000 mg | CHEWABLE_TABLET | ORAL | Status: DC
  Administered 2015-09-04: 80 mg via ORAL
  Filled 2015-09-03 (×2): qty 1

## 2015-09-03 MED ORDER — DIPHENHYDRAMINE HCL 25 MG PO CAPS: 25.0000 mg | ORAL_CAPSULE | ORAL | Status: DC | PRN

## 2015-09-03 MED ORDER — MEPERIDINE HCL 25 MG/ML IJ SOLN: 6.2500 mg | INTRAMUSCULAR | Status: DC | PRN

## 2015-09-03 MED ORDER — ACETAMINOPHEN 325 MG PO TABS: 650.0000 mg | ORAL_TABLET | ORAL | Status: DC | PRN

## 2015-09-03 MED ORDER — TETANUS-DIPHTH-ACELL PERTUSSIS 5-2.5-18.5 LF-MCG/0.5 IM SUSP: 0.5000 mL | Freq: Once | INTRAMUSCULAR | Status: DC

## 2015-09-03 MED ORDER — ZOLPIDEM TARTRATE 5 MG PO TABS: 5.0000 mg | ORAL_TABLET | Freq: Every evening | ORAL | Status: DC | PRN

## 2015-09-03 MED ORDER — MENTHOL 3 MG MT LOZG: 1.0000 | LOZENGE | OROMUCOSAL | Status: DC | PRN

## 2015-09-03 MED ORDER — DIPHENHYDRAMINE HCL 50 MG/ML IJ SOLN: 12.5000 mg | INTRAMUSCULAR | Status: DC | PRN

## 2015-09-03 NOTE — Addendum Note (Signed)
Addendum  created 09/03/15 1602 by Jhonnie GarnerBeth M Coralynn Gaona, CRNA   Modules edited: Clinical Notes   Clinical Notes:  File: [id number]; File: [id number]

## 2015-09-03 NOTE — Anesthesia Postprocedure Evaluation (Addendum)
Anesthesia Post Note  Patient: Robin Palmer  Procedure(s) Performed: Procedure(s) (LRB): CESAREAN SECTION (N/A)  Patient location during evaluation: Mother Baby Anesthesia Type: General Level of consciousness: awake and alert and oriented Pain management: pain level controlled Vital Signs Assessment: post-procedure vital signs reviewed and stable Respiratory status: spontaneous breathing Cardiovascular status: blood pressure returned to baseline Postop Assessment: no headache, no backache, no signs of nausea or vomiting and adequate PO intake Anesthetic complications: no    Last Vitals:  Filed Vitals:   09/03/15 0815 09/03/15 1200  BP: 120/74 101/61  Pulse: 62 81  Temp: 37.3 C 36.5 C  Resp: 16 18    Last Pain:  Filed Vitals:   09/03/15 1431  PainSc: Asleep                 Robin Palmer

## 2015-09-03 NOTE — Plan of Care (Signed)
Per Dr. Sallye OberKulwa patient does not need supplemental nasal canula oxygen if O2 sat is 95 or greater, call if BP's are greater than or equal to 160/100

## 2015-09-03 NOTE — Progress Notes (Signed)
MOB was referred for history of depression/anxiety. * Referral screened out by Clinical Social Worker because none of the following criteria appear to apply: ~ History of anxiety/depression during this pregnancy, or of post-partum depression. ~ Diagnosis of anxiety and/or depression within last 3 years OR * MOB's symptoms currently being treated with medication and/or therapy. Please contact the Clinical Social Worker if needs arise, or if MOB requests.  Documentation stating bout of depression 5 years ago. 

## 2015-09-03 NOTE — Op Note (Signed)
NAMEBRINA, UMEDA              ACCOUNT NO.:  [id number]  MEDICAL RECORD NO.:  [id number]  LOCATION:  9120                          FACILITY:  [facility]  PHYSICIAN:  Pieter Partridge, MD   DATE OF BIRTH:  1986/03/28  DATE OF PROCEDURE:  09/02/2015 DATE OF DISCHARGE:                              OPERATIVE REPORT   PREOPERATIVE DIAGNOSES:  Intrauterine pregnancy at 39-6/7th weeks, fetal arrhythmia followed by bradycardia.  POSTOPERATIVE DIAGNOSES:  Intrauterine pregnancy at 39-6/7th weeks, fetal arrhythmia followed by bradycardia.  PROCEDURE:  Primary low-transverse cesarean section, emergent with two- layer closure.  SURGEON:  Pieter Partridge, MD  ASSISTANT:  Clyde Lundborg, certified nurse midwife and technician.  ANESTHESIA:  Epidural and general.  EBL:  700 mL.  200 mL of clear urine at end of case.  DRAINS:  Foley catheter.  SPECIMEN:  Placenta.  Disposition of specimen to Pathology.  DISPOSITION:  To PACU, hemodynamically stable.  COMPLICATIONS:  None.  FINDINGS:  Viable female infant with tight nuchal cord.  Moderate meconium.  Normal uterus, fallopian tubes, and ovaries bilaterally. Reassuring Apgars.  Cord pH pending.  Weight pending.  INDICATIONS:  Ms. Nakama is a 30 year old, gravida 1, who initially presented to birthing suite in active labor.  Her labor course was augmented with Pitocin and the patient was progressing normally.  She was approximately 8-9 cm at 6 o'clock and baby was a +1 station. Tracing had been category 1 and there were no concerns.  Per the RN, the baby started having an irregular heart rate.  A fetal scalp electrode was placed and that was a very strange tracing that appeared to have artifact; however, the nurses in the room could hear that it was a very atypical heart rate.  Upon my arrival to the room, ultrasound was at the bedside.  I did place an ultrasound and confirmed that there was an arrhythmia, the heart appeared to be kind of  quivering.  It was difficult to hear initially whether the baby was bradycardic or the arrhythmia because these were close together.  I did do fetal scalp stim, baby's heart rate got up to 180, but did seem to come out of the abnormal rhythm at that point.  Fetal scalp stim was continued and then discontinued and then it sounded like the baby had a bradycardic episode.  We put back on the external monitor and it was noted that the heart rate was in the 60.  I attempted fetal scalp stim again and the heart rate continued to be bradycardic.  At this point, both then noting that there was something acutely wrong with the fetal heart rate.  The patient was consented verbally on a primary C-section and the reason for recommended the procedure and she was transported back to the operating room emergently and a stat C-section had been called.  Upon arrival to the OR suite, there were several staffs to help get the patient prepared for surgery emergently, they attempted to increase her epidural so that she could remain awake; however, by the time, she was prepped, she was not adequate confirmed by Anesthesia by clamping the skin with an Allis.  Betadine was splashed on the  abdomen.  Drape was placed.  Once we did not get adequate and it was confirmed that she was not known, it was recommended that she go to sleep for general anesthesia.  At that time prior to draping her, the fetal heart rate was 45.  A Pfannenstiel skin incision was made with a scalpel and that was carried down to the underlying layer of the fascia with the scalpel. The fascial incision was extended with the Mayo scissors.  Rectus muscles were dissected bluntly.  The rectus muscles were separated bluntly at the midline.  Peritoneum was entered bluntly.  Peritoneum was stretched.  Bladder blade was inserted.  Lower uterine segment incision was made and distended manually.  Head was brought to the incision. Nuchal cord was then  manually reduced.  Baby was not suctioned immediately, but did come out with the delayed cry.  NICU staff was awaiting and ready for the baby.  Cord segment was obtained or clamped off for cord gas.  Cord blood was then obtained.  Placenta was then removed and the uterus was cleared of all clots and debris.  The hysterotomy incision was then closed with 0 Vicryl in a continuous locked fashion, second layer of the same suture was used for imbrication.  Copious irrigation was performed.  Ovaries and fallopian tubes were inspected and they were normal.  Peritoneum was then reapproximated with 2-0 Vicryl in a continuous running fashion.  Fascia was then reapproximated with 0 Vicryl.  Subcu was reapproximated with 2- 0 plain gut and then the skin was reapproximated in a subcuticular fashion with 4-0 Monocryl.  Dermabond was to be applied as well as a pressure dressing and honeycomb.  The patient did receive Ancef 2 g IV upon arrival to the surgery suite, SCDs were on.     Pieter PartridgeEvelyn B Jailynne Opperman, MD     EBV/MEDQ  D:  09/02/2015  T:  09/03/2015  Job:  [id number]

## 2015-09-03 NOTE — Progress Notes (Signed)
Subjective: Postpartum Day 1: Cesarean Delivery due to fetal arrhythmia followed by bradycardia. Patient up ad lib, reports no syncope or dizziness. Feeding:  breast Contraceptive plan:  OCP  Objective: Vital signs in last 24 hours: Temp:  [97.5 F (36.4 C)-98.8 F (37.1 C)] 97.6 F (36.4 C) (04/08 0330) Pulse Rate:  [60-104] 62 (04/08 0330) Resp:  [11-20] 16 (04/08 0330) BP: (109-156)/(65-108) 121/79 mmHg (04/08 0330) SpO2:  [95 %-100 %] 97 % (04/08 0330)  Physical Exam:  General: alert and cooperative Lochia: appropriate Uterine Fundus: firm Abdomen:  + bowel sounds, non distended Incision: healing well  Pressure dressing CDI DVT Evaluation: No evidence of DVT seen on physical exam. Homan's sign: Negative   Recent Labs  09/02/15 0416 09/02/15 2224 09/03/15 0646  HGB 10.3* 11.2* 10.1*  HCT 31.9* 34.8* 31.6*  WBC 8.6 19.3* 20.5*   Orthostatics stable.  Assessment: Status post Cesarean section day 1. Doing well postoperatively.  Pressure dressing in place, no significant drainage Foley cath in place, draining clear yellow urine Anemia - hemodynamicly stable.   Circumcision: in patient   Plan: Continue current care. Breastfeeding and Lactation consult Remove outer dressing Iron supplement   Garielle Mroz, CNM, MSN 09/03/2015. 10:13 AM

## 2015-09-03 NOTE — Lactation Note (Signed)
This note was copied from a baby's chart. Lactation Consultation Note  Patient Name: Boy Juleen StarrHeather Garland Today's Date: 09/03/2015 Reason for consult: Initial assessment Baby at 25 hr of life and mom stated that bf is going well. She denies breast or nipple pain. Discussed baby behavior, feeding frequency, baby belly size, voids, wt loss, breast changes, and nipple care. Demonstrated manual expression, colostrum noted bilaterally, spoon in room. Given lactation handouts. Aware of OP services and support group.    Maternal Data Has patient been taught Hand Expression?: Yes Does the patient have breastfeeding experience prior to this delivery?: No  Feeding Feeding Type: Breast Fed Length of feed: 13 min  LATCH Score/Interventions Latch: Grasps breast easily, tongue down, lips flanged, rhythmical sucking.  Audible Swallowing: A few with stimulation  Type of Nipple: Everted at rest and after stimulation  Comfort (Breast/Nipple): Soft / non-tender     Hold (Positioning): Assistance needed to correctly position infant at breast and maintain latch.  LATCH Score: 8  Lactation Tools Discussed/Used WIC Program: No   Consult Status Consult Status: Follow-up Date: 09/04/15 Follow-up type: In-patient    Rulon Eisenmengerlizabeth E Johnika Escareno 09/03/2015, 8:51 PM

## 2015-09-04 NOTE — Lactation Note (Signed)
This note was copied from a baby's chart. Lactation Consultation Note  Patient Name: Robin Palmer Today's Date: 09/04/2015 Reason for consult: Follow-up assessment Baby at 44 hr of life and mom reports bf is going well. She denies breast or nipple pain, no concerns voiced. Baby does have a 6% wt loss and set for circumcision today. Set up DEBP for mom to post pump if baby has trouble latching later tonight. Discussed baby behavior, feeding frequency, baby belly size, voids, wt loss, breast changes, and nipple care. She is aware of lactation services and will call as needed.    Maternal Data    Feeding Feeding Type: Breast Fed Length of feed: 23 min  LATCH Score/Interventions Latch: Grasps breast easily, tongue down, lips flanged, rhythmical sucking.  Audible Swallowing: Spontaneous and intermittent  Type of Nipple: Everted at rest and after stimulation  Comfort (Breast/Nipple): Soft / non-tender     Hold (Positioning): No assistance needed to correctly position infant at breast.  LATCH Score: 10  Lactation Tools Discussed/Used Pump Review: Setup, frequency, and cleaning;Milk Storage Initiated by:: ES Date initiated:: 09/04/15   Consult Status Consult Status: Follow-up Date: 09/05/15 Follow-up type: In-patient    Robin Palmer 09/04/2015, 4:24 PM

## 2015-09-04 NOTE — Progress Notes (Addendum)
Subjective: Postpartum Day 2: Cesarean Delivery due to fetal arrhythmia followed by bradycardia Patient up ad lib, reports no syncope or dizziness. Feeding:  breast Contraceptive plan:  OCP  Objective: Vital signs in last 24 hours: Temp:  [97.6 F (36.4 C)-97.7 F (36.5 C)] 97.6 F (36.4 C) (04/08 1720) Pulse Rate:  [69-81] 69 (04/08 1720) Resp:  [18] 18 (04/08 1720) BP: (99-101)/(61-64) 99/64 mmHg (04/08 1720) SpO2:  [97 %-98 %] 98 % (04/08 1720)  Physical Exam:  General: alert and cooperative Lochia: appropriate Uterine Fundus: firm Abdomen:  + bowel sounds, non distended Incision: healing well  Honeycomb dressing CDI DVT Evaluation: No evidence of DVT seen on physical exam. Homan's sign: Negative   Recent Labs  09/02/15 0416 09/02/15 2224 09/03/15 0646  HGB 10.3* 11.2* 10.1*  HCT 31.9* 34.8* 31.6*  WBC 8.6 19.3* 20.5*   Orthostatics stable.  Assessment: Status post Cesarean section day 1. Doing well postoperatively.  Honeycomb dressing in place, no significant drainage Anemia - hemodynamicly stable.   Circumcision: in patient   Plan: Continue current care. Plan for discharge tomorrow, Breastfeeding and Lactation consult    Venus Standard, CNM, MSN 09/04/2015. 8:52 AM  I saw and examined patient at bedside and agree with above findings,  assessment and plan.  For inpatient circumcision tomorrow.  Dr. Sallye OberKulwa.

## 2015-09-04 NOTE — Addendum Note (Signed)
Addendum  created 09/04/15 1626 by Lewie LoronJohn Khori Underberg, MD   Modules edited: Clinical Notes   Clinical Notes:  File: [id number]; Pend: [id number]; Pend: [id number]; Pend: [id number]; Pend: [id number]

## 2015-09-04 NOTE — Progress Notes (Signed)
Called to bedside for lower back "swelling and numbness". Pt seen and examined. No numbness in lower extremities and able to ambulate without issue. Back soft without evidence of infection. I told her that the swelling was likely due to IV fluids. The numbness is non-dermatomal and is likely due to swelling as well or possible cutaneous nerve. I told the patient and nurse to monitor it and if nothing improves we would evaluate her further.

## 2015-09-05 ENCOUNTER — Encounter (HOSPITAL_COMMUNITY): Payer: Self-pay | Admitting: Obstetrics and Gynecology

## 2015-09-05 MED ORDER — OXYCODONE HCL 5 MG PO TABS: 5.0000 mg | ORAL_TABLET | ORAL | Status: DC | PRN

## 2015-09-05 MED ORDER — IBUPROFEN 600 MG PO TABS: 600.0000 mg | ORAL_TABLET | Freq: Four times a day (QID) | ORAL | Status: DC

## 2015-09-05 NOTE — Discharge Summary (Signed)
OB Discharge Summary     Patient Name: Robin Palmer DOB: [birth year] MRN: [id number]  Date of admission: 09/02/2015 Delivering MD: Geryl Rankins   Date of discharge: 09/05/2015  Admitting diagnosis: 40 WEEKS CTX Intrauterine pregnancy: [redacted]w[redacted]d     Secondary diagnosis:  Principal Problem:   Normal labor Active Problems:   H/O LEEP (loop electrosurgical excision procedure) of cervix complicating pregnancy   H/O: depression (no meds)   GERD (gastroesophageal reflux disease)  Additional problems: None     Discharge diagnosis: Term Pregnancy delivered, S/p emergent cesarean section                                                                                              Post partum procedures:None  Augmentation: Pitocin  Complications: None  Hospital course:  Onset of Labor With Unplanned C/S  30 y.o. yo G1P1001 at [redacted]w[redacted]d was admitted in Latent Labor on 09/02/2015. Patient had a labor course significant for Fetal arrhythmia followed by bradycardia. Membrane Rupture Time/Date: 10:48 AM ,09/02/2015   The patient went for cesarean section due to Non-Reassuring FHR, and delivered a Viable infant,09/02/2015  Details of operation can be found in separate operative note. Patient had an uncomplicated postpartum course.  She is ambulating,tolerating a regular diet, passing flatus, and urinating well.  Patient is discharged home in stable condition 09/11/2015.  Physical exam  Filed Vitals:   09/03/15 1200 09/03/15 1720 09/04/15 1831 09/05/15 0602  BP: 101/61 99/64 119/79 108/74  Pulse: 81 69 79 70  Temp: 97.7 F (36.5 C) 97.6 F (36.4 C) 98.3 F (36.8 C) 97.5 F (36.4 C)  TempSrc: Oral Oral Oral Oral  Resp: Height:      Weight:      SpO2: 97% 98% 98% 98%   General: alert, cooperative and no distress Lochia: appropriate Uterine Fundus: firm Incision: Healing well with no significant drainage DVT Evaluation: No evidence of DVT seen on physical exam. Calf/Ankle edema  is present Labs: Lab Results  Component Value Date   WBC 20.5* 09/03/2015   HGB 10.1* 09/03/2015   HCT 31.6* 09/03/2015   MCV 86.8 09/03/2015   PLT 211 09/03/2015   CMP Latest Ref Rng 09/02/2015  Glucose 65 - 99 mg/dL 81  BUN 6 - 20 mg/dL 9  Creatinine 4.09 - 8.11 mg/dL 9.14(N)  Sodium 829 - 562 mmol/L 141  Potassium 3.5 - 5.1 mmol/L 4.4  Chloride 101 - 111 mmol/L 111  CO2 22 - 32 mmol/L 24  Calcium 8.9 - 10.3 mg/dL 1.3(Y)  Total Protein 6.5 - 8.1 g/dL 8.6(V)  Total Bilirubin 0.3 - 1.2 mg/dL 0.5  Alkaline Phos 38 - 126 U/L 216(H)  AST 15 - 41 U/L 43(H)  ALT 14 - 54 U/L 13(L)    Discharge instruction: per After Visit Summary and "Baby and Me Booklet".  After visit meds:    Medication List    STOP taking these medications        benzonatate 200 MG capsule  Commonly known as:  TESSALON     cyclobenzaprine 10 MG tablet  Commonly known as:  FLEXERIL      TAKE these medications        esomeprazole 20 MG capsule  Commonly known as:  NEXIUM  Take 20 mg by mouth daily at 12 noon.     ibuprofen 600 MG tablet  Commonly known as:  ADVIL,MOTRIN  Take 1 tablet (600 mg total) by mouth every 6 (six) hours.     oxyCODONE 5 MG immediate release tablet  Commonly known as:  Oxy IR/ROXICODONE  Take 1 tablet (5 mg total) by mouth every 4 (four) hours as needed (pain scale 4-7).     prenatal multivitamin Tabs tablet  Take 1 tablet by mouth daily at 12 noon.     valACYclovir 500 MG tablet  Commonly known as:  VALTREX  Take 1 tablet by mouth 2 (two) times daily.        Diet: routine diet  Activity: Advance as tolerated. Pelvic rest for 6 weeks.   Outpatient follow up:2 weeks Follow up Appt:No future appointments. Follow up Visit:No Follow-up on file.  Postpartum contraception: Not Discussed  Newborn Data: Live born female  Birth Weight: 7 lb 8.5 oz (3415 g) APGAR: 8, 9  Baby Feeding: Breast Disposition:home with mother  Circumcision done day of  discharge.   09/05/2015 Geryl RankinsVARNADO, Grae Cannata, MD

## 2015-09-05 NOTE — Lactation Note (Signed)
This note was copied from a baby's chart. Lactation Consultation Note  Patient Name: Robin Palmer Today's Date: 09/05/2015 Reason for consult: Follow-up assessment  With this mom and term baby, now 5861 hours old. He returned from the CNS after his circumcision while I was in the room, and mom latched the baby easily, strong rhythmic suckles and swallows. Mom knows to call lactation for questions/concerns. Mom has slightly sore nipple, from allowing baby to slip when he falls asleep, and caused some mild scabbing on both nipples. i advised mom to not allow him to suck if shallow, but to unlatch him then obtain a deeper latch. I gave mom comfort gels, and instructed her in their use and care.    Maternal Data    Feeding Feeding Type: Breast Fed Length of feed: 14 min  LATCH Score/Interventions Latch: Grasps breast easily, tongue down, lips flanged, rhythmical sucking.  Audible Swallowing: Spontaneous and intermittent  Type of Nipple: Everted at rest and after stimulation  Comfort (Breast/Nipple): Filling, red/small blisters or bruises, mild/mod discomfort  Problem noted: Mild/Moderate discomfort Interventions (Mild/moderate discomfort): Comfort gels  Hold (Positioning): Assistance needed to correctly position infant at breast and maintain latch. Intervention(s): Breastfeeding basics reviewed;Support Pillows;Position options;Skin to skin  LATCH Score: 8  Lactation Tools Discussed/Used     Consult Status Consult Status: Complete Follow-up type: Call as needed    Alfred LevinsLee, Sovereign Ramiro Anne 09/05/2015, 9:27 AM

## 2015-09-05 NOTE — Discharge Instructions (Signed)
Cesarean Delivery, Care After  Refer to this sheet in the next few weeks. These instructions provide you with information on caring for yourself after your procedure. Your health care provider may also give you specific instructions. Your treatment has been planned according to current medical practices, but problems sometimes occur. Call your health care provider if you have any problems or questions after you go home.  HOME CARE INSTRUCTIONS   Only take over-the-counter or prescription medications as directed by your health care provider.   Do not drink alcohol, especially if you are breastfeeding or taking medication to relieve pain.   Do not chew or smoke tobacco.   Continue to use good perineal care. Good perineal care includes:    Wiping your perineum from front to back.    Keeping your perineum clean.   Check your surgical cut (incision) daily for increased redness, drainage, swelling, or separation of skin.   Clean your incision gently with soap and water every day, and then pat it dry. If your health care provider says it is okay, leave the incision uncovered. Use a bandage (dressing) if the incision is draining fluid or appears irritated. If the adhesive strips across the incision do not fall off within 7 days, carefully peel them off.   Hug a pillow when coughing or sneezing until your incision is healed. This helps to relieve pain.   Do not use tampons or douche until your health care provider says it is okay.   Shower, wash your hair, and take tub baths as directed by your health care provider.   Wear a well-fitting bra that provides breast support.   Limit wearing support panties or control-top hose.   Drink enough fluids to keep your urine clear or pale yellow.   Eat high-fiber foods such as whole grain cereals and breads, brown rice, beans, and fresh fruits and vegetables every day. These foods may help prevent or relieve constipation.   Resume activities such as climbing stairs,  driving, lifting, exercising, or traveling as directed by your health care provider.   Talk to your health care provider about resuming sexual activities. This is dependent upon your risk of infection, your rate of healing, and your comfort and desire to resume sexual activity.   Try to have someone help you with your household activities and your newborn for at least a few days after you leave the hospital.   Rest as much as possible. Try to rest or take a nap when your newborn is sleeping.   Increase your activities gradually.   Keep all of your scheduled postpartum appointments. It is very important to keep your scheduled follow-up appointments. At these appointments, your health care provider will be checking to make sure that you are healing physically and emotionally.  SEEK MEDICAL CARE IF:    You are passing large clots from your vagina. Save any clots to show your health care provider.   You have a foul smelling discharge from your vagina.   You have trouble urinating.   You are urinating frequently.   You have pain when you urinate.   You have a change in your bowel movements.   You have increasing redness, pain, or swelling near your incision.   You have pus draining from your incision.   Your incision is separating.   You have painful, hard, or reddened breasts.   You have a severe headache.   You have blurred vision or see spots.   You feel sad   or depressed.   You have thoughts of hurting yourself or your newborn.   You have questions about your care, the care of your newborn, or medications.   You are dizzy or light-headed.   You have a rash.   You have pain, redness, or swelling at the site of the removed intravenous access (IV) tube.   You have nausea or vomiting.   You stopped breastfeeding and have not had a menstrual period within 12 weeks of stopping.   You are not breastfeeding and have not had a menstrual period within 12 weeks of delivery.   You have a fever.  SEEK  IMMEDIATE MEDICAL CARE IF:   You have persistent pain.   You have chest pain.   You have shortness of breath.   You faint.   You have leg pain.   You have stomach pain.   Your vaginal bleeding saturates 2 or more sanitary pads in 1 hour.  MAKE SURE YOU:    Understand these instructions.   Will watch your condition.   Will get help right away if you are not doing well or get worse.     This information is not intended to replace advice given to you by your health care provider. Make sure you discuss any questions you have with your health care provider.     Document Released: 02/03/2002 Document Revised: 06/04/2014 Document Reviewed: 01/09/2012  Elsevier Interactive Patient Education 2016 Elsevier Inc.

## 2015-09-11 ENCOUNTER — Inpatient Hospital Stay (HOSPITAL_COMMUNITY): Payer: BLUE CROSS/BLUE SHIELD

## 2016-05-16 DIAGNOSIS — F53 Puerperal psychosis: Secondary | ICD-10-CM | POA: Diagnosis not present

## 2016-05-24 DIAGNOSIS — M545 Low back pain: Secondary | ICD-10-CM | POA: Diagnosis not present

## 2016-05-24 DIAGNOSIS — M25511 Pain in right shoulder: Secondary | ICD-10-CM | POA: Diagnosis not present

## 2016-05-24 DIAGNOSIS — M25512 Pain in left shoulder: Secondary | ICD-10-CM | POA: Diagnosis not present

## 2016-05-24 DIAGNOSIS — M542 Cervicalgia: Secondary | ICD-10-CM | POA: Diagnosis not present

## 2016-10-18 DIAGNOSIS — Z01411 Encounter for gynecological examination (general) (routine) with abnormal findings: Secondary | ICD-10-CM | POA: Diagnosis not present

## 2016-11-30 DIAGNOSIS — N926 Irregular menstruation, unspecified: Secondary | ICD-10-CM | POA: Diagnosis not present

## 2016-11-30 DIAGNOSIS — F411 Generalized anxiety disorder: Secondary | ICD-10-CM | POA: Diagnosis not present

## 2016-11-30 DIAGNOSIS — Z3202 Encounter for pregnancy test, result negative: Secondary | ICD-10-CM | POA: Diagnosis not present

## 2017-11-07 DIAGNOSIS — Z3201 Encounter for pregnancy test, result positive: Secondary | ICD-10-CM | POA: Diagnosis not present

## 2017-11-27 ENCOUNTER — Emergency Department (HOSPITAL_COMMUNITY)
Admission: EM | Admit: 2017-11-27 | Discharge: 2017-11-27 | Disposition: A | Discharge status: Home / Self Care | Payer: BLUE CROSS/BLUE SHIELD | Attending: Emergency Medicine | Admitting: Emergency Medicine

## 2017-11-27 ENCOUNTER — Encounter (HOSPITAL_COMMUNITY): Payer: Self-pay | Admitting: Emergency Medicine

## 2017-11-27 ENCOUNTER — Other Ambulatory Visit: Payer: Self-pay

## 2017-11-27 DIAGNOSIS — Z3A08 8 weeks gestation of pregnancy: Secondary | ICD-10-CM | POA: Diagnosis not present

## 2017-11-27 DIAGNOSIS — W880XXA Exposure to X-rays, initial encounter: Secondary | ICD-10-CM

## 2017-11-27 DIAGNOSIS — O9989 Other specified diseases and conditions complicating pregnancy, childbirth and the puerperium: Secondary | ICD-10-CM | POA: Insufficient documentation

## 2017-11-27 DIAGNOSIS — O356XX0 Maternal care for (suspected) damage to fetus by radiation, not applicable or unspecified: Secondary | ICD-10-CM | POA: Diagnosis not present

## 2017-11-27 DIAGNOSIS — Z3A01 Less than 8 weeks gestation of pregnancy: Secondary | ICD-10-CM | POA: Diagnosis not present

## 2017-11-27 DIAGNOSIS — Z79899 Other long term (current) drug therapy: Secondary | ICD-10-CM | POA: Insufficient documentation

## 2017-11-27 NOTE — Discharge Instructions (Addendum)
[facility] and [facility] should be no problem. X-rays are focused on the main area and there should not be a problem. Call Dr. Billy Coastaavon tomorrow for follow up and to discuss any concerns.

## 2017-11-27 NOTE — ED Provider Notes (Signed)
[facility]-EMERGENCY DEPT Provider Note   CSN: [id number] Arrival date & time: 11/27/17  1951     History   Chief Complaint Chief Complaint  Patient presents with  . radiation exposure    HPI Robin Palmer is a 32 y.o. G2P1001 @ approximately [redacted] weeks gestation who presents to the ED after being in the room with her dog at the [employer]. When it had an x-ray. Patient concerned about the radiation exposure.  HPI  Past Medical History:  Diagnosis Date  . Medical history non-contributory     Patient Active Problem List   Diagnosis Date Noted  . Normal labor 09/02/2015  . H/O LEEP (loop electrosurgical excision procedure) of cervix complicating pregnancy 09/02/2015  . H/O: depression (no meds) 09/02/2015  . GERD (gastroesophageal reflux disease) 09/02/2015    Past Surgical History:  Procedure Laterality Date  . CESAREAN SECTION N/A 09/02/2015   Procedure: CESAREAN SECTION;  Surgeon: Geryl RankinsEvelyn Varnado, MD;  Location: [facility] ORS;  Service: Obstetrics;  Laterality: N/A;  . NO PAST SURGERIES       OB History    Gravida  1   Para  1   Term  1   Preterm      AB      Living  1     SAB      TAB      Ectopic      Multiple  0   Live Births  1            Home Medications    Prior to Admission medications   Medication Sig Start Date End Date Taking? Authorizing Provider  esomeprazole (NEXIUM) 20 MG capsule Take 20 mg by mouth daily at 12 noon.    [provider]  Prenatal Vit-Fe Fumarate-FA (PRENATAL MULTIVITAMIN) TABS tablet Take 1 tablet by mouth daily at 12 noon.    [provider]  valACYclovir (VALTREX) 500 MG tablet Take 1 tablet by mouth 2 (two) times daily. 06/19/15   [provider]    Family History No family history on file.  Social History Social History   Tobacco Use  . Smoking status: Never Smoker  Substance Use Topics  . Alcohol use: No  . Drug use: No     Allergies   Patient has no known  allergies.   Review of Systems Review of Systems  All other systems reviewed and are negative.    Physical Exam Updated Vital Signs BP 117/82 (BP Location: Right Arm)   Pulse 80   Temp 97.8 F (36.6 C) (Oral)   Resp 16   SpO2 100%    Physical Exam  Constitutional: She appears well-developed and well-nourished. No distress.  HENT:  Head: Normocephalic.  Neck: Neck supple.  Cardiovascular: Normal rate.  Pulmonary/Chest: Effort normal.  Psychiatric: She has a normal mood and affect.     ED Treatments / Results  Labs (all labs ordered are listed, but only abnormal results are displayed) Labs Reviewed - No data to display Radiology No results found.  Procedures Procedures (including critical care time)  Medications Ordered in ED Medications - No data to display   Initial Impression / Assessment and Plan / ED Course  I have reviewed the triage vital signs and the nursing notes. 32 y.o. female here after being in a room with a dog having an x-ray stable for d/c. [facility]ure[facility] Patient to f/u with Dr. Billy Coastaavon.  Final Clinical Impressions(s) / ED Diagnoses   Final diagnoses:  [redacted] weeks gestation of pregnancy  Exposure to x-rays, initial encounter    ED Discharge Orders    None       Kerrie [place], [place] 11/27/17 2053    Melene Plan, DO 11/27/17 2146

## 2017-11-27 NOTE — ED Notes (Signed)
Patient verbalized understanding of discharge instructions, no questions. Patient ambulated out of ED with steady gait in no distress.  

## 2017-11-27 NOTE — ED Triage Notes (Addendum)
Pt from home with complaints of recent exposure to radiation. 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 Pt followed protocol of office and was told to come here to be evaluated. Pt is [redacted] weeks pregnant. Pt denies abnormal discharge or pain.

## 2017-12-09 DIAGNOSIS — Z6820 Body mass index (BMI) 20.0-20.9, adult: Secondary | ICD-10-CM | POA: Diagnosis not present

## 2017-12-09 DIAGNOSIS — Z3201 Encounter for pregnancy test, result positive: Secondary | ICD-10-CM | POA: Diagnosis not present

## 2017-12-12 LAB — OB RESULTS CONSOLE RUBELLA ANTIBODY, IGM: Rubella: IMMUNE

## 2017-12-12 LAB — OB RESULTS CONSOLE ABO/RH: RH Type: POSITIVE

## 2017-12-12 LAB — OB RESULTS CONSOLE ANTIBODY SCREEN: Antibody Screen: NEGATIVE

## 2017-12-12 LAB — OB RESULTS CONSOLE HIV ANTIBODY (ROUTINE TESTING): HIV: NONREACTIVE

## 2017-12-12 LAB — OB RESULTS CONSOLE RPR: RPR: NONREACTIVE

## 2017-12-12 LAB — OB RESULTS CONSOLE HEPATITIS B SURFACE ANTIGEN: Hepatitis B Surface Ag: NEGATIVE

## 2017-12-13 DIAGNOSIS — Z3481 Encounter for supervision of other normal pregnancy, first trimester: Secondary | ICD-10-CM | POA: Diagnosis not present

## 2017-12-13 DIAGNOSIS — Z3689 Encounter for other specified antenatal screening: Secondary | ICD-10-CM | POA: Diagnosis not present

## 2017-12-19 DIAGNOSIS — Z3A10 10 weeks gestation of pregnancy: Secondary | ICD-10-CM | POA: Diagnosis not present

## 2017-12-19 DIAGNOSIS — Z3689 Encounter for other specified antenatal screening: Secondary | ICD-10-CM | POA: Diagnosis not present

## 2017-12-19 DIAGNOSIS — O3440 Maternal care for other abnormalities of cervix, unspecified trimester: Secondary | ICD-10-CM | POA: Diagnosis not present

## 2017-12-19 LAB — OB RESULTS CONSOLE GC/CHLAMYDIA
CHLAMYDIA, DNA PROBE: NEGATIVE
Gonorrhea: NEGATIVE

## 2018-01-14 DIAGNOSIS — O3440 Maternal care for other abnormalities of cervix, unspecified trimester: Secondary | ICD-10-CM | POA: Diagnosis not present

## 2018-01-14 DIAGNOSIS — Z3A14 14 weeks gestation of pregnancy: Secondary | ICD-10-CM | POA: Diagnosis not present

## 2018-01-30 DIAGNOSIS — R300 Dysuria: Secondary | ICD-10-CM | POA: Diagnosis not present

## 2018-01-30 DIAGNOSIS — O3440 Maternal care for other abnormalities of cervix, unspecified trimester: Secondary | ICD-10-CM | POA: Diagnosis not present

## 2018-01-30 DIAGNOSIS — O26892 Other specified pregnancy related conditions, second trimester: Secondary | ICD-10-CM | POA: Diagnosis not present

## 2018-01-30 DIAGNOSIS — Z3A16 16 weeks gestation of pregnancy: Secondary | ICD-10-CM | POA: Diagnosis not present

## 2018-01-30 DIAGNOSIS — Z361 Encounter for antenatal screening for raised alphafetoprotein level: Secondary | ICD-10-CM | POA: Diagnosis not present

## 2018-02-13 DIAGNOSIS — Z3A18 18 weeks gestation of pregnancy: Secondary | ICD-10-CM | POA: Diagnosis not present

## 2018-02-13 DIAGNOSIS — Z363 Encounter for antenatal screening for malformations: Secondary | ICD-10-CM | POA: Diagnosis not present

## 2018-02-13 DIAGNOSIS — O26892 Other specified pregnancy related conditions, second trimester: Secondary | ICD-10-CM | POA: Diagnosis not present

## 2018-02-13 DIAGNOSIS — O3440 Maternal care for other abnormalities of cervix, unspecified trimester: Secondary | ICD-10-CM | POA: Diagnosis not present

## 2018-02-26 DIAGNOSIS — F411 Generalized anxiety disorder: Secondary | ICD-10-CM | POA: Diagnosis not present

## 2018-02-28 DIAGNOSIS — O3440 Maternal care for other abnormalities of cervix, unspecified trimester: Secondary | ICD-10-CM | POA: Diagnosis not present

## 2018-02-28 DIAGNOSIS — Z3A20 20 weeks gestation of pregnancy: Secondary | ICD-10-CM | POA: Diagnosis not present

## 2018-03-19 DIAGNOSIS — F331 Major depressive disorder, recurrent, moderate: Secondary | ICD-10-CM | POA: Diagnosis not present

## 2018-03-19 DIAGNOSIS — O3440 Maternal care for other abnormalities of cervix, unspecified trimester: Secondary | ICD-10-CM | POA: Diagnosis not present

## 2018-03-19 DIAGNOSIS — F411 Generalized anxiety disorder: Secondary | ICD-10-CM | POA: Diagnosis not present

## 2018-03-19 DIAGNOSIS — Z3A23 23 weeks gestation of pregnancy: Secondary | ICD-10-CM | POA: Diagnosis not present

## 2018-03-19 DIAGNOSIS — Z23 Encounter for immunization: Secondary | ICD-10-CM | POA: Diagnosis not present

## 2018-04-10 DIAGNOSIS — F411 Generalized anxiety disorder: Secondary | ICD-10-CM | POA: Diagnosis not present

## 2018-04-10 DIAGNOSIS — F331 Major depressive disorder, recurrent, moderate: Secondary | ICD-10-CM | POA: Diagnosis not present

## 2018-04-14 DIAGNOSIS — O3442 Maternal care for other abnormalities of cervix, second trimester: Secondary | ICD-10-CM | POA: Diagnosis not present

## 2018-04-14 DIAGNOSIS — Z3689 Encounter for other specified antenatal screening: Secondary | ICD-10-CM | POA: Diagnosis not present

## 2018-04-14 DIAGNOSIS — Z3A27 27 weeks gestation of pregnancy: Secondary | ICD-10-CM | POA: Diagnosis not present

## 2018-04-14 DIAGNOSIS — O26872 Cervical shortening, second trimester: Secondary | ICD-10-CM | POA: Diagnosis not present

## 2018-04-22 DIAGNOSIS — O99810 Abnormal glucose complicating pregnancy: Secondary | ICD-10-CM | POA: Diagnosis not present

## 2018-04-22 DIAGNOSIS — Z3689 Encounter for other specified antenatal screening: Secondary | ICD-10-CM | POA: Diagnosis not present

## 2018-04-22 DIAGNOSIS — Z3A28 28 weeks gestation of pregnancy: Secondary | ICD-10-CM | POA: Diagnosis not present

## 2018-04-28 DIAGNOSIS — O3440 Maternal care for other abnormalities of cervix, unspecified trimester: Secondary | ICD-10-CM | POA: Diagnosis not present

## 2018-04-28 DIAGNOSIS — Z23 Encounter for immunization: Secondary | ICD-10-CM | POA: Diagnosis not present

## 2018-04-28 DIAGNOSIS — Z3A29 29 weeks gestation of pregnancy: Secondary | ICD-10-CM | POA: Diagnosis not present

## 2018-05-15 DIAGNOSIS — O3663X0 Maternal care for excessive fetal growth, third trimester, not applicable or unspecified: Secondary | ICD-10-CM | POA: Diagnosis not present

## 2018-05-15 DIAGNOSIS — Z3A31 31 weeks gestation of pregnancy: Secondary | ICD-10-CM | POA: Diagnosis not present

## 2018-05-27 DIAGNOSIS — Z3A33 33 weeks gestation of pregnancy: Secondary | ICD-10-CM | POA: Diagnosis not present

## 2018-05-27 DIAGNOSIS — O3663X0 Maternal care for excessive fetal growth, third trimester, not applicable or unspecified: Secondary | ICD-10-CM | POA: Diagnosis not present

## 2018-05-28 NOTE — L&D Delivery Note (Signed)
Delivery Note At 11:04 AM a viable and healthy female was delivered via Vaginal, Spontaneous (Presentation: OA ).  APGAR:8 ,9 ; weight  pending.   Placenta status: spontaneous, intact.  Cord:  with the following complications: none.  Cord pH: na  Anesthesia:  epidural Episiotomy: None Lacerations: 2nd degree Suture Repair: 2.0 vicryl rapide Est. Blood Loss (mL):  900  Mom to postpartum.  Baby to Couplet care / Skin to Skin  Successful VBAC.  Franklin Baumbach J 07/10/2018, 11:29 AM

## 2018-06-10 DIAGNOSIS — Z3685 Encounter for antenatal screening for Streptococcus B: Secondary | ICD-10-CM | POA: Diagnosis not present

## 2018-06-10 DIAGNOSIS — O3663X0 Maternal care for excessive fetal growth, third trimester, not applicable or unspecified: Secondary | ICD-10-CM | POA: Diagnosis not present

## 2018-06-10 DIAGNOSIS — Z3A35 35 weeks gestation of pregnancy: Secondary | ICD-10-CM | POA: Diagnosis not present

## 2018-06-10 LAB — OB RESULTS CONSOLE GBS: GBS: NEGATIVE

## 2018-06-17 DIAGNOSIS — Z3A36 36 weeks gestation of pregnancy: Secondary | ICD-10-CM | POA: Diagnosis not present

## 2018-06-17 DIAGNOSIS — O3663X0 Maternal care for excessive fetal growth, third trimester, not applicable or unspecified: Secondary | ICD-10-CM | POA: Diagnosis not present

## 2018-06-18 ENCOUNTER — Inpatient Hospital Stay (HOSPITAL_COMMUNITY)
Admission: AD | Admit: 2018-06-18 | Discharge: 2018-06-18 | Disposition: A | Discharge status: Home / Self Care | Payer: BLUE CROSS/BLUE SHIELD | Attending: Obstetrics and Gynecology | Admitting: Obstetrics and Gynecology

## 2018-06-18 ENCOUNTER — Encounter (HOSPITAL_COMMUNITY): Payer: Self-pay

## 2018-06-18 DIAGNOSIS — R0981 Nasal congestion: Secondary | ICD-10-CM | POA: Diagnosis not present

## 2018-06-18 DIAGNOSIS — Z3A36 36 weeks gestation of pregnancy: Secondary | ICD-10-CM | POA: Diagnosis not present

## 2018-06-18 DIAGNOSIS — J069 Acute upper respiratory infection, unspecified: Secondary | ICD-10-CM | POA: Insufficient documentation

## 2018-06-18 DIAGNOSIS — O26893 Other specified pregnancy related conditions, third trimester: Secondary | ICD-10-CM | POA: Diagnosis not present

## 2018-06-18 DIAGNOSIS — Z20828 Contact with and (suspected) exposure to other viral communicable diseases: Secondary | ICD-10-CM | POA: Diagnosis not present

## 2018-06-18 DIAGNOSIS — O99513 Diseases of the respiratory system complicating pregnancy, third trimester: Secondary | ICD-10-CM | POA: Insufficient documentation

## 2018-06-18 LAB — URINALYSIS, ROUTINE W REFLEX MICROSCOPIC
Bilirubin Urine: NEGATIVE
Glucose, UA: NEGATIVE mg/dL
Hgb urine dipstick: NEGATIVE
Ketones, ur: NEGATIVE mg/dL
LEUKOCYTES UA: NEGATIVE
Nitrite: NEGATIVE
Protein, ur: 30 mg/dL — AB
Specific Gravity, Urine: 1.027 (ref 1.005–1.030)
pH: 5 (ref 5.0–8.0)

## 2018-06-18 LAB — INFLUENZA PANEL BY PCR (TYPE A & B)
INFLBPCR: NEGATIVE
Influenza A By PCR: NEGATIVE

## 2018-06-18 LAB — GROUP A STREP BY PCR: Group A Strep by PCR: NOT DETECTED

## 2018-06-18 MED ORDER — GUAIFENESIN ER 600 MG PO TB12
600.0000 mg | ORAL_TABLET | Freq: Once | ORAL | Status: AC
  Administered 2018-06-18: 600 mg via ORAL
  Filled 2018-06-18: qty 1

## 2018-06-18 MED ORDER — HYDROCOD POLST-CPM POLST ER 10-8 MG/5ML PO SUER: 5.0000 mL | Freq: Two times a day (BID) | ORAL | 0 refills | Status: DC | PRN

## 2018-06-18 MED ORDER — GUAIFENESIN ER 600 MG PO TB12: 600.0000 mg | ORAL_TABLET | Freq: Two times a day (BID) | ORAL | 1 refills | Status: DC

## 2018-06-18 MED ORDER — HYDROCOD POLST-CPM POLST ER 10-8 MG/5ML PO SUER: 5.0000 mL | Freq: Once | ORAL | Status: DC

## 2018-06-18 MED ORDER — HYDROCODONE-ACETAMINOPHEN 5-325 MG PO TABS
1.0000 | ORAL_TABLET | Freq: Once | ORAL | Status: AC
  Administered 2018-06-18: 1 via ORAL
  Filled 2018-06-18: qty 1

## 2018-06-18 NOTE — Discharge Instructions (Signed)
Upper Respiratory Infection, Adult An upper respiratory infection (URI) is a common viral infection of the nose, throat, and upper air passages that lead to the lungs. The most common type of URI is the common cold. URIs usually get better on their own, without medical treatment. What are the causes? A URI is caused by a virus. You may catch a virus by:  Breathing in droplets from an infected person's cough or sneeze.  Touching something that has been exposed to the virus (contaminated) and then touching your mouth, nose, or eyes. What increases the risk? You are more likely to get a URI if:  You are very young or very old.  It is autumn or winter.  You have close contact with others, such as at a daycare, school, or health care facility.  You smoke.  You have long-term (chronic) heart or lung disease.  You have a weakened disease-fighting (immune) system.  You have nasal allergies or asthma.  You are experiencing a lot of stress.  You work in an area that has poor air circulation.  You have poor nutrition. What are the signs or symptoms? A URI usually involves some of the following symptoms:  Runny or stuffy (congested) nose.  Sneezing.  Cough.  Sore throat.  Headache.  Fatigue.  Fever.  Loss of appetite.  Pain in your forehead, behind your eyes, and over your cheekbones (sinus pain).  Muscle aches.  Redness or irritation of the eyes.  Pressure in the ears or face. How is this diagnosed? This condition may be diagnosed based on your medical history and symptoms, and a physical exam. Your health care provider may use a cotton swab to take a mucus sample from your nose (nasal swab). This sample can be tested to determine what virus is causing the illness. How is this treated? URIs usually get better on their own within 7-10 days. You can take steps at home to relieve your symptoms. Medicines cannot cure URIs, but your health care provider may recommend  certain medicines to help relieve symptoms, such as:  Over-the-counter cold medicines.  Cough suppressants. Coughing is a type of defense against infection that helps to clear the respiratory system, so take these medicines only as recommended by your health care provider.  Fever-reducing medicines. Follow these instructions at home: Activity  Rest as needed.  If you have a fever, stay home from work or school until your fever is gone or until your health care provider says you are no longer contagious. Your health care provider may have you wear a face mask to prevent your infection from spreading. Relieving symptoms  Gargle with a salt-water mixture 3-4 times a day or as needed. To make a salt-water mixture, completely dissolve -1 tsp of salt in 1 cup of warm water.  Use a cool-mist humidifier to add moisture to the air. This can help you breathe more easily. Eating and drinking   Drink enough fluid to keep your urine pale yellow.  Eat soups and other clear broths. General instructions   Take over-the-counter and prescription medicines only as told by your health care provider. These include cold medicines, fever reducers, and cough suppressants.  Do not use any products that contain nicotine or tobacco, such as cigarettes and e-cigarettes. If you need help quitting, ask your health care provider.  Stay away from secondhand smoke.  Stay up to date on all immunizations, including the yearly (annual) flu vaccine.  Keep all follow-up visits as told by your health   care provider. This is important. How to prevent the spread of infection to others   URIs can be passed from person to person (are contagious). To prevent the infection from spreading: ? Wash your hands often with soap and water. If soap and water are not available, use hand sanitizer. ? Avoid touching your mouth, face, eyes, or nose. ? Cough or sneeze into a tissue or your sleeve or elbow instead of into your hand  or into the air. Contact a health care provider if:  You are getting worse instead of better.  You have a fever or chills.  Your mucus is brown or red.  You have yellow or brown discharge coming from your nose.  You have pain in your face, especially when you bend forward.  You have swollen neck glands.  You have pain while swallowing.  You have white areas in the back of your throat. Get help right away if:  You have shortness of breath that gets worse.  You have severe or persistent: ? Headache. ? Ear pain. ? Sinus pain. ? Chest pain.  You have chronic lung disease along with any of the following: ? Wheezing. ? Prolonged cough. ? Coughing up blood. ? A change in your usual mucus.  You have a stiff neck.  You have changes in your: ? Vision. ? Hearing. ? Thinking. ? Mood. Summary  An upper respiratory infection (URI) is a common infection of the nose, throat, and upper air passages that lead to the lungs.  A URI is caused by a virus.  URIs usually get better on their own within 7-10 days.  Medicines cannot cure URIs, but your health care provider may recommend certain medicines to help relieve symptoms. This information is not intended to replace advice given to you by your health care provider. Make sure you discuss any questions you have with your health care provider. Document Released: 11/07/2000 Document Revised: 12/28/2016 Document Reviewed: 12/28/2016 Elsevier Interactive Patient Education  2019 Elsevier Inc.    

## 2018-06-18 NOTE — MAU Provider Note (Signed)
Chief Complaint:  Cough; Nasal Congestion; and Abdominal Pain   First Provider Initiated Contact with Patient 06/18/18 2014     HPI: Robin Palmer is a 33 y.o. G2P1001 at 19w4dwho presents to maternity admissions reporting nasal congestion, cough, and possible exposure to flu and strep.  Started about 5 days ago.  No fever. . She reports good fetal movement, denies LOF, vaginal bleeding, vaginal itching/burning, urinary symptoms, h/a, dizziness, n/v, diarrhea, constipation or fever/chills.  She denies headache, visual changes or RUQ abdominal pain.  Cough  This is a new problem. The current episode started in the past 7 days. The problem has been unchanged. The cough is non-productive. Associated symptoms include nasal congestion, postnasal drip, rhinorrhea and shortness of breath. Pertinent negatives include no ear congestion or sweats. Nothing aggravates the symptoms. Risk factors: Exposure to person iwth flu and strep. Treatments tried: Tylenol. The treatment provided no relief.   RN Note: Pt here with complaints of flu-like symptoms. Reports nasal congestion and cough. Today she feels like it is hard to catch her breath. Pt has not checked temp. States she had baby shower on Saturday and had a friend that had strep and flu that was diagnosed today. States she has taken benadryl, Tylenol and hot tea with honey, but nothing is helping. Pt also reports abdominal pain, hurts more with coughing, but is sore in between coughing as well. Pt denies LOF or vaginal bleeding. Reports good fetal movement.   Past Medical History: History reviewed. No pertinent past medical history.  Past obstetric history: OB History  Gravida Para Term Preterm AB Living  2 1 1     1   SAB TAB Ectopic Multiple Live Births               # Outcome Date GA Lbr Len/2nd Weight Sex Delivery Anes PTL Lv  2 Current           1 Term      CS-Unspec       Past Surgical History: Past Surgical History:  Procedure Laterality  Date  . APPENDECTOMY    . BREAST ENHANCEMENT SURGERY    . CESAREAN SECTION      Family History: History reviewed. No pertinent family history.  Social History: Social History   Tobacco Use  . Smoking status: Never Smoker  . Smokeless tobacco: Never Used  Substance Use Topics  . Alcohol use: Never    Frequency: Never  . Drug use: Never    Allergies: No Known Allergies  Meds:  No medications prior to admission.    I have reviewed patient's Past Medical Hx, Surgical Hx, Family Hx, Social Hx, medications and allergies.   ROS:  Review of Systems  HENT: Positive for postnasal drip and rhinorrhea.   Respiratory: Positive for cough and shortness of breath.    Other systems negative  Physical Exam   Patient Vitals for the past 24 hrs:  BP Temp Temp src Pulse Resp SpO2 Height Weight  06/18/18 1923 116/82 98.9 F (37.2 C) Oral (!) 101 (!) 21 100 % 5\' 3"  (1.6 m) 63 kg   Constitutional: Well-developed, well-nourished female in no acute distress, but ill-appearing. ENT:  Nasal congestion, intermittent coughing. .  Cardiovascular: normal rate and rhythm Respiratory: normal effort, clear to auscultation bilaterally GI: Abd soft, non-tender, gravid appropriate for gestational age.   No rebound or guarding. MS: Extremities nontender, no edema, normal ROM Neurologic: Alert and oriented x 4.  GU: Neg CVAT.  PELVIC EXAM: deferred  FHT:  Baseline 145 , moderate variability, accelerations present, no decelerations Contractions:  Rare   Labs: Results for orders placed or performed during the hospital encounter of 06/18/18 (from the past 24 hour(s))  Influenza panel by PCR (type A & B)     Status: None   Collection Time: 06/18/18  8:00 PM  Result Value Ref Range   Influenza A By PCR NEGATIVE NEGATIVE   Influenza B By PCR NEGATIVE NEGATIVE  Urinalysis, Routine w reflex microscopic     Status: Abnormal   Collection Time: 06/18/18  8:19 PM  Result Value Ref Range   Color,  Urine AMBER (A) YELLOW   APPearance CLOUDY (A) CLEAR   Specific Gravity, Urine 1.027 1.005 - 1.030   pH 5.0 5.0 - 8.0   Glucose, UA NEGATIVE NEGATIVE mg/dL   Hgb urine dipstick NEGATIVE NEGATIVE   Bilirubin Urine NEGATIVE NEGATIVE   Ketones, ur NEGATIVE NEGATIVE mg/dL   Protein, ur 30 (A) NEGATIVE mg/dL   Nitrite NEGATIVE NEGATIVE   Leukocytes, UA NEGATIVE NEGATIVE   RBC / HPF 0-5 0 - 5 RBC/hpf   WBC, UA 6-10 0 - 5 WBC/hpf   Bacteria, UA RARE (A) NONE SEEN   Squamous Epithelial / LPF 21-50 0 - 5   Mucus PRESENT   Group A Strep by PCR     Status: None   Collection Time: 06/18/18  8:37 PM  Result Value Ref Range   Group A Strep by PCR NOT DETECTED NOT DETECTED    Imaging:  No results found.  MAU Course/MDM: I have ordered labs and reviewed results.  NST reviewed, reactive with only occasional contractions Consult Dr Despina HiddenEure with presentation, exam findings and test results. He does not recommend Tamiflu due to prolonged exposure time and limited efficacy. .   We gave mucinex and hydrocodone for cough  Assessment: Single intrauterine pregnancy at 6761w5d Upper Respiratory Infection Exposure to flu and strep  Plan: Discharge home Supportive care Rx Mucinex and Tussionex for URI symptoms  Labor precautions and fetal kick counts Follow up in Office for prenatal visits and recheck of status I called patient at home after she left with results of negative strep and flu  Encouraged to return here or to other Urgent Care/ED if she develops worsening of symptoms, increase in pain, fever, or other concerning symptoms.   Pt stable at time of discharge.  Wynelle BourgeoisMarie Williams CNM, MSN Certified Nurse-Midwife 06/18/2018 8:14 PM

## 2018-06-18 NOTE — MAU Note (Signed)
Pt here with complaints of flu-like symptoms. Reports nasal congestion and cough. Today she feels like it is hard to catch her breath. Pt has not checked temp. States she had baby shower on Saturday and had a friend that had strep and flu that was diagnosed today. States she has taken benadryl, Tylenol and hot tea with honey, but nothing is helping. Pt also reports abdominal pain, hurts more with coughing, but is sore in between coughing as well. Pt denies LOF or vaginal bleeding. Reports good fetal movement.

## 2018-06-22 ENCOUNTER — Encounter (HOSPITAL_COMMUNITY): Payer: Self-pay | Admitting: Emergency Medicine

## 2018-06-23 DIAGNOSIS — Z3A37 37 weeks gestation of pregnancy: Secondary | ICD-10-CM | POA: Diagnosis not present

## 2018-06-23 DIAGNOSIS — O3663X0 Maternal care for excessive fetal growth, third trimester, not applicable or unspecified: Secondary | ICD-10-CM | POA: Diagnosis not present

## 2018-06-30 DIAGNOSIS — Z3A38 38 weeks gestation of pregnancy: Secondary | ICD-10-CM | POA: Diagnosis not present

## 2018-06-30 DIAGNOSIS — O3663X0 Maternal care for excessive fetal growth, third trimester, not applicable or unspecified: Secondary | ICD-10-CM | POA: Diagnosis not present

## 2018-07-07 ENCOUNTER — Other Ambulatory Visit: Payer: Self-pay | Admitting: Obstetrics and Gynecology

## 2018-07-07 DIAGNOSIS — O3663X0 Maternal care for excessive fetal growth, third trimester, not applicable or unspecified: Secondary | ICD-10-CM | POA: Diagnosis not present

## 2018-07-07 DIAGNOSIS — Z3A39 39 weeks gestation of pregnancy: Secondary | ICD-10-CM | POA: Diagnosis not present

## 2018-07-09 DIAGNOSIS — Z3A39 39 weeks gestation of pregnancy: Secondary | ICD-10-CM | POA: Diagnosis not present

## 2018-07-09 DIAGNOSIS — O3663X0 Maternal care for excessive fetal growth, third trimester, not applicable or unspecified: Secondary | ICD-10-CM | POA: Diagnosis not present

## 2018-07-10 ENCOUNTER — Inpatient Hospital Stay (HOSPITAL_COMMUNITY): Payer: BLUE CROSS/BLUE SHIELD | Admitting: Anesthesiology

## 2018-07-10 ENCOUNTER — Other Ambulatory Visit: Payer: Self-pay

## 2018-07-10 ENCOUNTER — Encounter (HOSPITAL_COMMUNITY): Payer: Self-pay

## 2018-07-10 ENCOUNTER — Inpatient Hospital Stay (HOSPITAL_COMMUNITY)
Admission: AD | Admit: 2018-07-10 | Discharge: 2018-07-12 | DRG: 806 | Disposition: A | Discharge status: Home / Self Care | Payer: BLUE CROSS/BLUE SHIELD | Attending: Obstetrics and Gynecology | Admitting: Obstetrics and Gynecology

## 2018-07-10 DIAGNOSIS — Z3483 Encounter for supervision of other normal pregnancy, third trimester: Secondary | ICD-10-CM | POA: Diagnosis not present

## 2018-07-10 DIAGNOSIS — O2243 Hemorrhoids in pregnancy, third trimester: Secondary | ICD-10-CM | POA: Diagnosis present

## 2018-07-10 DIAGNOSIS — O3663X0 Maternal care for excessive fetal growth, third trimester, not applicable or unspecified: Secondary | ICD-10-CM | POA: Diagnosis present

## 2018-07-10 DIAGNOSIS — Z3490 Encounter for supervision of normal pregnancy, unspecified, unspecified trimester: Secondary | ICD-10-CM

## 2018-07-10 DIAGNOSIS — O9081 Anemia of the puerperium: Secondary | ICD-10-CM | POA: Diagnosis not present

## 2018-07-10 DIAGNOSIS — O34219 Maternal care for unspecified type scar from previous cesarean delivery: Principal | ICD-10-CM | POA: Diagnosis present

## 2018-07-10 DIAGNOSIS — Z412 Encounter for routine and ritual male circumcision: Secondary | ICD-10-CM | POA: Diagnosis not present

## 2018-07-10 DIAGNOSIS — O34211 Maternal care for low transverse scar from previous cesarean delivery: Secondary | ICD-10-CM | POA: Diagnosis not present

## 2018-07-10 DIAGNOSIS — Z3A39 39 weeks gestation of pregnancy: Secondary | ICD-10-CM

## 2018-07-10 DIAGNOSIS — D62 Acute posthemorrhagic anemia: Secondary | ICD-10-CM | POA: Diagnosis not present

## 2018-07-10 DIAGNOSIS — O9902 Anemia complicating childbirth: Secondary | ICD-10-CM | POA: Diagnosis not present

## 2018-07-10 LAB — CBC
HCT: 35.1 % — ABNORMAL LOW (ref 36.0–46.0)
Hemoglobin: 11.1 g/dL — ABNORMAL LOW (ref 12.0–15.0)
MCH: 26.7 pg (ref 26.0–34.0)
MCHC: 31.6 g/dL (ref 30.0–36.0)
MCV: 84.6 fL (ref 80.0–100.0)
Platelets: 274 10*3/uL (ref 150–400)
RBC: 4.15 MIL/uL (ref 3.87–5.11)
RDW: 14.6 % (ref 11.5–15.5)
WBC: 10.9 10*3/uL — ABNORMAL HIGH (ref 4.0–10.5)
nRBC: 0 % (ref 0.0–0.2)

## 2018-07-10 LAB — RPR: RPR: NONREACTIVE

## 2018-07-10 LAB — POCT FERN TEST: POCT Fern Test: POSITIVE

## 2018-07-10 MED ORDER — LIDOCAINE HCL (PF) 1 % IJ SOLN
30.0000 mL | INTRAMUSCULAR | Status: DC | PRN
  Filled 2018-07-10: qty 30

## 2018-07-10 MED ORDER — OXYTOCIN BOLUS FROM INFUSION
500.0000 mL | Freq: Once | INTRAVENOUS | Status: AC
  Administered 2018-07-10: 500 mL via INTRAVENOUS

## 2018-07-10 MED ORDER — IBUPROFEN 600 MG PO TABS
600.0000 mg | ORAL_TABLET | Freq: Four times a day (QID) | ORAL | Status: DC
  Administered 2018-07-10 – 2018-07-12 (×7): 600 mg via ORAL
  Filled 2018-07-10 (×7): qty 1

## 2018-07-10 MED ORDER — ONDANSETRON HCL 4 MG PO TABS: 4.0000 mg | ORAL_TABLET | ORAL | Status: DC | PRN

## 2018-07-10 MED ORDER — SIMETHICONE 80 MG PO CHEW: 80.0000 mg | CHEWABLE_TABLET | ORAL | Status: DC | PRN

## 2018-07-10 MED ORDER — PHENYLEPHRINE 40 MCG/ML (10ML) SYRINGE FOR IV PUSH (FOR BLOOD PRESSURE SUPPORT)
80.0000 ug | PREFILLED_SYRINGE | INTRAVENOUS | Status: DC | PRN
  Filled 2018-07-10: qty 10

## 2018-07-10 MED ORDER — METHYLERGONOVINE MALEATE 0.2 MG/ML IJ SOLN: 0.2000 mg | INTRAMUSCULAR | Status: DC | PRN

## 2018-07-10 MED ORDER — ACETAMINOPHEN 325 MG PO TABS
650.0000 mg | ORAL_TABLET | ORAL | Status: DC | PRN
  Administered 2018-07-11: 650 mg via ORAL
  Filled 2018-07-10: qty 2

## 2018-07-10 MED ORDER — TRANEXAMIC ACID-NACL 1000-0.7 MG/100ML-% IV SOLN: 1000.0000 mg | INTRAVENOUS | Status: AC

## 2018-07-10 MED ORDER — METHYLERGONOVINE MALEATE 0.2 MG PO TABS: 0.2000 mg | ORAL_TABLET | ORAL | Status: DC | PRN

## 2018-07-10 MED ORDER — ONDANSETRON HCL 4 MG/2ML IJ SOLN: 4.0000 mg | INTRAMUSCULAR | Status: DC | PRN

## 2018-07-10 MED ORDER — TETANUS-DIPHTH-ACELL PERTUSSIS 5-2.5-18.5 LF-MCG/0.5 IM SUSP: 0.5000 mL | Freq: Once | INTRAMUSCULAR | Status: DC

## 2018-07-10 MED ORDER — DIBUCAINE 1 % RE OINT: 1.0000 "application " | TOPICAL_OINTMENT | RECTAL | Status: DC | PRN

## 2018-07-10 MED ORDER — FENTANYL 2.5 MCG/ML BUPIVACAINE 1/10 % EPIDURAL INFUSION (WH - ANES)
14.0000 mL/h | INTRAMUSCULAR | Status: DC | PRN
  Administered 2018-07-10 (×2): 14 mL/h via EPIDURAL
  Filled 2018-07-10 (×2): qty 100

## 2018-07-10 MED ORDER — OXYCODONE-ACETAMINOPHEN 5-325 MG PO TABS: 1.0000 | ORAL_TABLET | ORAL | Status: DC | PRN

## 2018-07-10 MED ORDER — WITCH HAZEL-GLYCERIN EX PADS
1.0000 "application " | MEDICATED_PAD | CUTANEOUS | Status: DC | PRN
  Administered 2018-07-10: 1 via TOPICAL

## 2018-07-10 MED ORDER — METHYLERGONOVINE MALEATE 0.2 MG/ML IJ SOLN
0.2000 mg | Freq: Once | INTRAMUSCULAR | Status: AC
  Administered 2018-07-10: 0.2 mg via INTRAMUSCULAR

## 2018-07-10 MED ORDER — EPHEDRINE 5 MG/ML INJ
10.0000 mg | INTRAVENOUS | Status: DC | PRN
  Filled 2018-07-10: qty 2

## 2018-07-10 MED ORDER — METHYLERGONOVINE MALEATE 0.2 MG/ML IJ SOLN
INTRAMUSCULAR | Status: AC
  Filled 2018-07-10: qty 1

## 2018-07-10 MED ORDER — OXYTOCIN 40 UNITS IN NORMAL SALINE INFUSION - SIMPLE MED: 2.5000 [IU]/h | INTRAVENOUS | Status: DC

## 2018-07-10 MED ORDER — OXYTOCIN 40 UNITS IN NORMAL SALINE INFUSION - SIMPLE MED
1.0000 m[IU]/min | INTRAVENOUS | Status: DC
  Administered 2018-07-10: 2 m[IU]/min via INTRAVENOUS

## 2018-07-10 MED ORDER — COCONUT OIL OIL: 1.0000 "application " | TOPICAL_OIL | Status: DC | PRN

## 2018-07-10 MED ORDER — ONDANSETRON HCL 4 MG/2ML IJ SOLN
4.0000 mg | Freq: Four times a day (QID) | INTRAMUSCULAR | Status: DC | PRN
  Administered 2018-07-10 (×2): 4 mg via INTRAVENOUS
  Filled 2018-07-10 (×2): qty 2

## 2018-07-10 MED ORDER — SENNOSIDES-DOCUSATE SODIUM 8.6-50 MG PO TABS
2.0000 | ORAL_TABLET | ORAL | Status: DC
  Administered 2018-07-10 – 2018-07-11 (×2): 2 via ORAL
  Filled 2018-07-10 (×2): qty 2

## 2018-07-10 MED ORDER — DIPHENHYDRAMINE HCL 25 MG PO CAPS
25.0000 mg | ORAL_CAPSULE | Freq: Four times a day (QID) | ORAL | Status: DC | PRN
  Administered 2018-07-11: 25 mg via ORAL

## 2018-07-10 MED ORDER — LACTATED RINGERS IV SOLN
INTRAVENOUS | Status: DC
  Administered 2018-07-10 (×2): via INTRAVENOUS

## 2018-07-10 MED ORDER — SOD CITRATE-CITRIC ACID 500-334 MG/5ML PO SOLN: 30.0000 mL | ORAL | Status: DC | PRN

## 2018-07-10 MED ORDER — TRANEXAMIC ACID-NACL 1000-0.7 MG/100ML-% IV SOLN
INTRAVENOUS | Status: AC
  Administered 2018-07-10: 1000 mg
  Filled 2018-07-10: qty 100

## 2018-07-10 MED ORDER — PHENYLEPHRINE 40 MCG/ML (10ML) SYRINGE FOR IV PUSH (FOR BLOOD PRESSURE SUPPORT)
80.0000 ug | PREFILLED_SYRINGE | INTRAVENOUS | Status: DC | PRN
  Filled 2018-07-10 (×2): qty 10

## 2018-07-10 MED ORDER — PRENATAL MULTIVITAMIN CH
1.0000 | ORAL_TABLET | Freq: Every day | ORAL | Status: DC
  Administered 2018-07-11: 1 via ORAL
  Filled 2018-07-10: qty 1

## 2018-07-10 MED ORDER — BENZOCAINE-MENTHOL 20-0.5 % EX AERO
1.0000 "application " | INHALATION_SPRAY | CUTANEOUS | Status: DC | PRN
  Administered 2018-07-10 – 2018-07-11 (×2): 1 via TOPICAL
  Filled 2018-07-10 (×2): qty 56

## 2018-07-10 MED ORDER — LACTATED RINGERS IV SOLN
500.0000 mL | INTRAVENOUS | Status: DC | PRN
  Administered 2018-07-10: 1000 mL via INTRAVENOUS

## 2018-07-10 MED ORDER — FENTANYL CITRATE (PF) 100 MCG/2ML IJ SOLN
100.0000 ug | Freq: Once | INTRAMUSCULAR | Status: AC
  Administered 2018-07-10: 100 ug via INTRAVENOUS
  Filled 2018-07-10: qty 2

## 2018-07-10 MED ORDER — TERBUTALINE SULFATE 1 MG/ML IJ SOLN
0.2500 mg | Freq: Once | INTRAMUSCULAR | Status: DC | PRN
  Filled 2018-07-10: qty 1

## 2018-07-10 MED ORDER — OXYTOCIN 40 UNITS IN NORMAL SALINE INFUSION - SIMPLE MED
1.0000 m[IU]/min | INTRAVENOUS | Status: DC
  Filled 2018-07-10: qty 1000

## 2018-07-10 MED ORDER — ZOLPIDEM TARTRATE 5 MG PO TABS: 5.0000 mg | ORAL_TABLET | Freq: Every evening | ORAL | Status: DC | PRN

## 2018-07-10 MED ORDER — LIDOCAINE HCL (PF) 1 % IJ SOLN
INTRAMUSCULAR | Status: DC | PRN
  Administered 2018-07-10: 6 mL via EPIDURAL

## 2018-07-10 MED ORDER — ACETAMINOPHEN 325 MG PO TABS
650.0000 mg | ORAL_TABLET | ORAL | Status: DC | PRN
  Filled 2018-07-10: qty 2

## 2018-07-10 MED ORDER — OXYCODONE-ACETAMINOPHEN 5-325 MG PO TABS: 2.0000 | ORAL_TABLET | ORAL | Status: DC | PRN

## 2018-07-10 MED ORDER — DIPHENHYDRAMINE HCL 50 MG/ML IJ SOLN: 12.5000 mg | INTRAMUSCULAR | Status: DC | PRN

## 2018-07-10 MED ORDER — LACTATED RINGERS IV SOLN
500.0000 mL | Freq: Once | INTRAVENOUS | Status: AC
  Administered 2018-07-10: 500 mL via INTRAVENOUS

## 2018-07-10 NOTE — Anesthesia Preprocedure Evaluation (Signed)

## 2018-07-10 NOTE — Anesthesia Pain Management Evaluation Note (Signed)
  CRNA Pain Management Visit Note  Patient: Robin Palmer, 33 y.o., female  "Hello I am a member of the anesthesia team at [facility]. We have an anesthesia team available at all times to provide care throughout the hospital, including epidural management and anesthesia for C-section. I don't know your plan for the delivery whether it a natural birth, water birth, IV sedation, nitrous supplementation, doula or epidural, but we want to meet your pain goals."   1.Was your pain managed to your expectations on prior hospitalizations?   Yes   2.What is your expectation for pain management during this hospitalization?     Epidural  3.How can we help you reach that goal? Epidural management. Epi working well now.  Record the patient's initial score and the patient's pain goal.   Pain: 2  Pain Goal: 4 [facility] wants you to be able to say your pain was always managed very well.  Cleda Clarks 07/10/2018

## 2018-07-10 NOTE — Anesthesia Procedure Notes (Signed)
Epidural Patient location during procedure: OB Start time: 07/10/2018 3:22 AM End time: 07/10/2018 3:27 AM  Staffing Anesthesiologist: Bethena Midget, MD  Preanesthetic Checklist Completed: patient identified, site marked, surgical consent, pre-op evaluation, timeout performed, IV checked, risks and benefits discussed and monitors and equipment checked  Epidural Patient position: sitting Prep: site prepped and draped and DuraPrep Patient monitoring: continuous pulse ox and blood pressure Approach: midline Location: L3-L4 Injection technique: LOR air  Needle:  Needle type: Tuohy  Needle gauge: 17 G Needle length: 9 cm and 9 Needle insertion depth: 5 cm (3.5) and 3.5 cm Catheter type: closed end flexible Catheter size: 19 Gauge Catheter at skin depth: 8 cm Test dose: negative  Assessment Events: blood not aspirated, injection not painful, no injection resistance, negative IV test and no paresthesia

## 2018-07-10 NOTE — Lactation Note (Signed)
This note was copied from a baby's chart. Lactation Consultation Note  Patient Name: Robin Palmer Today's Date: 07/10/2018 Reason for consult: Initial assessment;Term   Initial assessment with Exp BF mom of 5 hour old infant. Infant STS and latched to mom's left breast with no active suckling noted. Mom reports he has been sleepy, she is latching when he is cueing. Infant with 1 BF for 20 minutes, 2 BF attempts, 1 void and 3 stools since birth. LATCH Scores 6-9.  Mom reports her milk supply dried up earlier than she wanted when her older son started sleeping through the night and she was not able to pump consistently at work. She reports she had a lot of anxiety when her son self weaned and says she is better prepared this time to use formula if needed.    Reviewed supply and demand and milk coming to volume. Enc mom to feed infant STS with feeding cues with goal of 8+ feedings in 24 hours. Enc mom to stimulate infant as needed with feedings. Enc mom to hand express and spoon feed infant if he will not BF. Reviewed normal NB feeding behaviors including cluster feeding. Mom reports she is aware of how to hand express.  BF Resources, Support Group handout and LC Brochure given, mom aware of IP/OP Services, BF Support Groups and LC phone #. Mom aware to call out for feeding assistance as needed. Mom declined need for Lactation assistance at this time and reports she has no questions/concerns.     Maternal Data Formula Feeding for Exclusion: Yes Reason for exclusion: Mother's choice to formula and breast feed on admission Has patient been taught Hand Expression?: Yes Does the patient have breastfeeding experience prior to this delivery?: Yes  Feeding Feeding Type: Breast Fed  LATCH Score Latch: Too sleepy or reluctant, no latch achieved, no sucking elicited.  Audible Swallowing: None  Type of Nipple: Everted at rest and after stimulation  Comfort (Breast/Nipple): Soft /  non-tender  Hold (Positioning): No assistance needed to correctly position infant at breast.  LATCH Score: 6  Interventions Interventions: Breast feeding basics reviewed;Skin to skin;Breast massage;Breast compression;Support pillows;Expressed milk  Lactation Tools Discussed/Used WIC Program: No   Consult Status Consult Status: Follow-up Date: 07/11/18 Follow-up type: In-patient    Silas Flood Rin Gorton 07/10/2018, 4:37 PM

## 2018-07-10 NOTE — MAU Note (Signed)
Reports SROM at 0115-clear fluid.  No VB.  Had her membranes stripped in the office today.  [facility]  Hx of hsv-on valtrex-last dose tonight.

## 2018-07-10 NOTE — H&P (Signed)
Robin Palmer is a 33 y.o. female presenting for SROM. OB History    Gravida  3   Para  2   Term  2   Preterm  0   AB  0   Living  1     SAB  0   TAB  0   Ectopic  0   Multiple      Live Births  1          Past Medical History:  Diagnosis Date  . Medical history non-contributory    Past Surgical History:  Procedure Laterality Date  . APPENDECTOMY    . BREAST ENHANCEMENT SURGERY    . CESAREAN SECTION N/A 09/02/2015   Procedure: CESAREAN SECTION;  Surgeon: Geryl Rankins, MD;  Location: [facility];  Service: Obstetrics;  Laterality: N/A;  . CESAREAN SECTION    . NO PAST SURGERIES     Family History: family history is not on file. Social History:  reports that she has never smoked. She has never used smokeless tobacco. She reports that she does not drink alcohol or use drugs.     Maternal Diabetes: No Genetic Screening: Normal Maternal Ultrasounds/Referrals: Normal Fetal Ultrasounds or other Referrals:  None Maternal Substance Abuse:  No Significant Maternal Medications:  None Significant Maternal Lab Results:  None Other Comments:  None  Review of Systems  Constitutional: Negative.   All other systems reviewed and are negative.  Maternal Medical History:  Reason for admission: Rupture of membranes and contractions.   Contractions: Onset was 3-5 hours ago.   Frequency: irregular.   Perceived severity is moderate.    Fetal activity: Perceived fetal activity is normal.   Last perceived fetal movement was within the past hour.    Prenatal complications: no prenatal complications Prenatal Complications - Diabetes: none.    Dilation: 5 Effacement (%): 100 Station: -2 Exam by:: Lexie Ament, RN Blood pressure 108/81, pulse 75, temperature 97.7 F (36.5 C), temperature source Oral, resp. rate 18, SpO2 100 %, unknown if currently breastfeeding. Maternal Exam:  Uterine Assessment: Contraction strength is moderate.  Contraction frequency is irregular.    Abdomen: Fetal presentation: vertex  Introitus: Normal vulva. Normal vagina.  Ferning test: positive.  Nitrazine test: positive. Amniotic fluid character: clear.  Pelvis: questionable for delivery.   Cervix: Cervix evaluated by digital exam.     Physical Exam  Nursing note and vitals reviewed. Constitutional: She is oriented to person, place, and time. She appears well-developed and well-nourished.  HENT:  Head: Normocephalic and atraumatic.  Neck: Normal range of motion. Neck supple.  Cardiovascular: Normal rate and regular rhythm.  Respiratory: Effort normal and breath sounds normal.  GI: Soft. Bowel sounds are normal.  Genitourinary:    Vulva, vagina and uterus normal.   Musculoskeletal: Normal range of motion.  Neurological: She is oriented to person, place, and time. She has normal reflexes.  Skin: Skin is warm and dry.  Psychiatric: She has a normal mood and affect.    Prenatal labs: ABO, Rh: --/--/O POS (02/13 3299) Antibody: NEG (02/13 0233) Rubella: Immune (07/18 0000) RPR: Nonreactive (07/18 0000)  HBsAg: Negative (07/18 0000)  HIV: Non-reactive (07/18 0000)  GBS: Negative (01/14 0000)   Assessment/Plan: Term IUP Active labor LGA TOLAC   Robin Palmer J 07/10/2018, 5:10 AM

## 2018-07-10 NOTE — Plan of Care (Signed)
  Problem: Education: Goal: Knowledge of condition will improve Note:  Admission education, safety and unit protocols reviewed with patient and significant other. Encouraged patient to call for assistance to go to the bathroom until otherwise instructed. Earl Gala, Linda Hedges [place]

## 2018-07-10 NOTE — Progress Notes (Signed)
Robin Palmer is a 33 y.o. G3P2001 at [redacted]w[redacted]d by LMP admitted for active labor, rupture of membranes  Subjective: Pushing  Objective: BP 94/82   Pulse 88   Temp 98.2 F (36.8 C) (Oral)   Resp 18   SpO2 100%  No intake/output data recorded. No intake/output data recorded.  FHT:  FHR: 145 bpm, variability: moderate,  accelerations:  Present,  decelerations:  Present variables with pushing UC:   regular, every 2-3 minutes SVE:   Dilation: 10 Effacement (%): 100 Station: Plus 2 Exam by:: A Showfety RN  Labs: Lab Results  Component Value Date   WBC 10.9 (H) 07/10/2018   HGB 11.1 (L) 07/10/2018   HCT 35.1 (L) 07/10/2018   MCV 84.6 07/10/2018   PLT 274 07/10/2018    Assessment / Plan: Augmentation of labor, progressing well TOLAC with good progression  Labor: Progressing normally Preeclampsia:  no signs or symptoms of toxicity Fetal Wellbeing:  Category I and Category II Pain Control:  Epidural I/D:  n/a Anticipated MOD:  guarded  Omara Alcon J 07/10/2018, 9:56 AM

## 2018-07-11 LAB — CBC
HCT: 17.6 % — ABNORMAL LOW (ref 36.0–46.0)
HCT: 26.2 % — ABNORMAL LOW (ref 36.0–46.0)
HEMOGLOBIN: 8.4 g/dL — AB (ref 12.0–15.0)
Hemoglobin: 5.6 g/dL — CL (ref 12.0–15.0)
MCH: 26.8 pg (ref 26.0–34.0)
MCH: 27.5 pg (ref 26.0–34.0)
MCHC: 31.8 g/dL (ref 30.0–36.0)
MCHC: 32.1 g/dL (ref 30.0–36.0)
MCV: 84.2 fL (ref 80.0–100.0)
MCV: 85.9 fL (ref 80.0–100.0)
Platelets: 193 10*3/uL (ref 150–400)
Platelets: 219 10*3/uL (ref 150–400)
RBC: 2.09 MIL/uL — ABNORMAL LOW (ref 3.87–5.11)
RBC: 3.05 MIL/uL — ABNORMAL LOW (ref 3.87–5.11)
RDW: 14.8 % (ref 11.5–15.5)
RDW: 14.7 % (ref 11.5–15.5)
WBC: 18.8 10*3/uL — AB (ref 4.0–10.5)
WBC: 17.1 10*3/uL — ABNORMAL HIGH (ref 4.0–10.5)
nRBC: 0 % (ref 0.0–0.2)
nRBC: 0 % (ref 0.0–0.2)

## 2018-07-11 LAB — PREPARE RBC (CROSSMATCH)

## 2018-07-11 MED ORDER — HYDROCORTISONE ACE-PRAMOXINE 1-1 % RE FOAM
1.0000 | Freq: Two times a day (BID) | RECTAL | Status: DC
  Administered 2018-07-11 (×2): 1 via RECTAL
  Filled 2018-07-11 (×2): qty 10

## 2018-07-11 MED ORDER — SODIUM CHLORIDE 0.9% IV SOLUTION: Freq: Once | INTRAVENOUS | Status: DC

## 2018-07-11 MED ORDER — DIPHENHYDRAMINE HCL 25 MG PO CAPS
25.0000 mg | ORAL_CAPSULE | Freq: Once | ORAL | Status: DC
  Filled 2018-07-11: qty 1

## 2018-07-11 MED ORDER — FERROUS SULFATE 325 (65 FE) MG PO TABS
325.0000 mg | ORAL_TABLET | Freq: Two times a day (BID) | ORAL | Status: DC
  Administered 2018-07-11: 325 mg via ORAL
  Filled 2018-07-11: qty 1

## 2018-07-11 MED ORDER — ACETAMINOPHEN 325 MG PO TABS
650.0000 mg | ORAL_TABLET | Freq: Once | ORAL | Status: AC
  Administered 2018-07-11: 650 mg via ORAL
  Filled 2018-07-11: qty 2

## 2018-07-11 NOTE — Progress Notes (Signed)
Interval note: Results for orders placed or performed during the hospital encounter of 07/10/18 (from the past 24 hour(s))  CBC     Status: Abnormal   Collection Time: 07/11/18  5:47 AM  Result Value Ref Range   WBC 18.8 (H) 4.0 - 10.5 K/uL   RBC 2.09 (L) 3.87 - 5.11 MIL/uL   Hemoglobin 5.6 (LL) 12.0 - 15.0 g/dL   HCT 79.7 (L) 28.2 - 06.0 %   MCV 84.2 80.0 - 100.0 fL   MCH 26.8 26.0 - 34.0 pg   MCHC 31.8 30.0 - 36.0 g/dL   RDW 15.6 15.3 - 79.4 %   Platelets 193 150 - 400 K/uL   nRBC 0.0 0.0 - 0.2 %  Prepare RBC     Status: None   Collection Time: 07/11/18  9:24 AM  Result Value Ref Range   Order Confirmation      ORDER PROCESSED BY BLOOD BANK Performed at [facility], [address], [place], [place] [address]   CBC     Status: Abnormal   Collection Time: 07/11/18  6:54 PM  Result Value Ref Range   WBC 17.1 (H) 4.0 - 10.5 K/uL   RBC 3.05 (L) 3.87 - 5.11 MIL/uL   Hemoglobin 8.4 (L) 12.0 - 15.0 g/dL   HCT 47.0 (L) 92.9 - 57.4 %   MCV 85.9 80.0 - 100.0 fL   MCH 27.5 26.0 - 34.0 pg   MCHC 32.1 30.0 - 36.0 g/dL   RDW 73.4 03.7 - 09.6 %   Platelets 219 150 - 400 K/uL   nRBC 0.0 0.0 - 0.2 %   RTC to nurse regarding CBC results and patient request to be discharged home. I came to discuss results with the patient, and due to critical low value and blood transfusion, recommend staying until tomorrow to ensure she will continue to be stable. Pt. Is agreeable to plan.  Will plan for discharge home tomorrow.  Carlean Jews, CNM

## 2018-07-11 NOTE — Progress Notes (Signed)
CSW received consult for hx of Anxiety.  CSW met with MOB to offer support and complete assessment.    CSW met with MOB to discuss consult for history of anxiety, FOB was present. CSW asked FOB to leave during assessment with MOB's permission, FOB left voluntarily. CSW introduced self and explained reason for consult. MOB was welcoming and remained engaged during assessment. MOB and CSW discussed MOB's mental health history. MOB reported that she was diagnosed with anxiety 10 years ago and was on Prozac initially but is no longer on that medication. CSW and MOB discussed/processed MOB's symptoms. MOB reported that her symptoms "come and go", noting her symptoms are worrying and dwelling on thoughts. MOB reported that she feels like she has health anxiety and certain things trigger her to have dwelling and spiraling thoughts. CSW inquired about MOB's coping skills, MOB reported that she started acupuncture and meditation which are effective. CSW praised MOB for her positive coping skills. CSW asked MOB if she had any other mental health diagnoses, MOB denied any other mental health diagnoses. CSW asked MOB if she experienced any postpartum depression with her first child, MOB reported maybe a little bit but recalled experiencing more post weaning anxiety. MOB reported that she was prescribed Zoloft to treat symptoms of anxiety stemming from post weaning and had a bad experience on that medication. MOB reported that she is not currently taking any medications and her anxiety is pretty well managed. MOB reported that she has an as needed prescription for Klonopin but has not taken it in a while. CSW inquired about MOB's support system, MOB reported that her mom is a good support and is always there to listen. MOB presented calm and had insight and awareness about her mental health history. MOB did not demonstrate any acute mental health signs/symptoms. CSW assessed for safety, MOB denied SI, HI and domestic  violence. CSW informed MOB that she may be more prone to PPD due to her mental health history.   CSW provided education regarding the baby blues period vs. perinatal mood disorders, discussed treatment and gave resources for mental health follow up if concerns arise.  CSW recommends self-evaluation during the postpartum time period using the New Mom Checklist from Postpartum Progress and encouraged MOB to contact a medical professional if symptoms are noted at any time.     CSW identifies no further need for intervention and no barriers to discharge at this time.  Robin Burch, LCSW Clinical Social Worker [facility] Cell#: [phone]           

## 2018-07-11 NOTE — Anesthesia Postprocedure Evaluation (Signed)
Anesthesia Post Note  Patient: Robin Palmer  Procedure(s) Performed: AN AD HOC LABOR EPIDURAL     Patient location during evaluation: Mother Baby Anesthesia Type: Epidural Level of consciousness: awake and alert and oriented Pain management: satisfactory to patient Vital Signs Assessment: post-procedure vital signs reviewed and stable Respiratory status: respiratory function stable Cardiovascular status: stable Postop Assessment: no headache, no backache, epidural receding, patient able to bend at knees, no signs of nausea or vomiting and adequate PO intake Anesthetic complications: no    Last Vitals:  Vitals:   07/10/18 2321 07/11/18 0509  BP: 96/64 91/65  Pulse: 88 84  Resp: 18 18  Temp: 36.8 C 36.6 C  SpO2: 98%     Last Pain:  Vitals:   07/11/18 0510  TempSrc:   PainSc: 4    Pain Goal: Patients Stated Pain Goal: 2 (07/10/18 1525)                 Peter Daquila

## 2018-07-11 NOTE — Progress Notes (Signed)
CSW acknowledged consult and attempted to meet with MOB.  MOB was being attended to by bedside nurse when CSW arrived.  CSW will attempt to meet with MOB at a later time.  Robin Plain, LCSW Clinical Social Worker [facility] Cell#: [phone] 

## 2018-07-11 NOTE — Lactation Note (Signed)
This note was copied from a baby's chart. Lactation Consultation Note  Patient Name: Robin Palmer Today's Date: 07/11/2018 Reason for consult: Follow-up assessment  2nd LC visit for today  Mom is being transfused with 2 Units of PRBC's presently.  Baby latched x 2 while LC in the room with depth. Multiple  Swallows noted and increased with breast compressions.  Baby has a recessed chin/ and easily eased down with latch/  And compressions / per mom comfortable.  LC mentioned to mom since she had 1100 ml EBM loss / even though  She is being transfused.  It would be recommended consider calling back for a LC OP in about 1 week  To check milk supply  With pre and post weight. Mom receptive and has the number.  Mother informed of post-discharge support and given phone number to the lactation department, including services for phone call assistance; out-patient appointments; and breastfeeding support group. List of other breastfeeding resources in the community given in the handout. Encouraged mother to call for problems or concerns related to breastfeeding.   Maternal Data Has patient been taught Hand Expression?: Yes  Feeding Feeding Type: Breast Fed  LATCH Score Latch: Grasps breast easily, tongue down, lips flanged, rhythmical sucking.  Audible Swallowing: Spontaneous and intermittent  Type of Nipple: Everted at rest and after stimulation  Comfort (Breast/Nipple): Soft / non-tender  Hold (Positioning): No assistance needed to correctly position infant at breast.  LATCH Score: 10  Interventions Interventions: Breast feeding basics reviewed;Assisted with latch;Skin to skin;Breast massage;Hand express;Breast compression  Lactation Tools Discussed/Used     Consult Status Consult Status: Follow-up Date: 07/12/18 Follow-up type: In-patient    Matilde Sprang Baleria Wyman 07/11/2018, 12:31 PM

## 2018-07-11 NOTE — Progress Notes (Signed)
CRITICAL VALUE ALERT  Critical Value: HgB 5.6  Date & Time Notied:  07/11/18 8280  Provider Notified: Marlinda Mike, CNM  Orders Received/Actions taken: Patient asymptomatic. No new orders, will evaluate patient this morning.

## 2018-07-11 NOTE — Lactation Note (Signed)
This note was copied from a baby's chart. Lactation Consultation Note  Patient Name: Robin Palmer Today's Date: 07/11/2018 Reason for consult: Follow-up assessment;Term;Infant weight loss(6% weight loss / LC encouraged mom to call with feeding cues for latdh assessment )  Parents asking for early D/C - will be reassessed later per Dr. Nash Dimmer am note  Baby is 21 hours old / in the tx nursery for a circ .  Per mom the baby recently fed 10 mins .  LC reviewed the doc flow sheets / WNL for age.  Per mom feels breastfeeding is going well.  LC reviewed sore nipples and engorgement prevention and tx .  Per mom  has a hand pump from her 1st baby and a DEBP Spectra.  LC recommended STS feedings until the baby is back to birth weight and gaining steadily.  Mother informed of post-discharge support and given phone number to the lactation department, including services for phone call assistance; out-patient appointments; and breastfeeding support group. List of other breastfeeding resources in the community given in the handout. Encouraged mother to call for problems or concerns related to breastfeeding.    Maternal Data Has patient been taught Hand Expression?: Yes  Feeding Feeding Type: (baby recently fed - RN - Latch Score )  LATCH Score ( Latch Score by the [facility] )  Latch: Grasps breast easily, tongue down, lips flanged, rhythmical sucking.  Audible Swallowing: A few with stimulation  Type of Nipple: Everted at rest and after stimulation  Comfort (Breast/Nipple): Soft / non-tender  Hold (Positioning): Assistance needed to correctly position infant at breast and maintain latch.  LATCH Score: 8  Interventions Interventions: Breast feeding basics reviewed  Lactation Tools Discussed/Used Pump Review: Milk Storage   Consult Status Consult Status: Follow-up Date: 07/11/18 Follow-up type: In-patient    Matilde Sprang Jef Futch 07/11/2018, 8:24 AM

## 2018-07-11 NOTE — Progress Notes (Addendum)
PPD #1, VBAC, 2nd degree repair, baby boy  S:  Reports feeling tired, did not sleep much last night             Tolerating po/ No nausea or vomiting / Denies CP, dizziness or SOB             Bleeding is moderate             Pain controlled with Motrin and Tylenol , Dermoplast spray              Up ad lib / ambulatory / voiding QS  Newborn breast feeding - latching okay; still needing to work on feeds today  / Circumcision - completed today   O:               VS: BP 91/65 (BP Location: Left Arm)   Pulse 84   Temp 97.8 F (36.6 C) (Oral)   Resp 18   SpO2 98%   Breastfeeding Unknown    07/11/18 05:09:51  97.8 F (36.6 C)  84  -  18  91/65  Lying  -  Room Air  - KS   07/10/18 23:21:08  98.2 F (36.8 C)  88  -  18  96/64  Lying  98 %  -  - KS   07/10/18 21:46:42  98.7 F (37.1 C)  95  -  18  101/60  Lying  98 %  Room Air  - CG   07/10/18 1930  98.4 F (36.9 C)  89  -  18  114/81  -  100 %  -  - MH   07/10/18 1526  97.6 F (36.4 C)  96  -  18  116/81  -  100 %  -  - MH   07/10/18 1410  98.2 F (36.8 C)  97  -  20  110/80  Semi-fowlers             LABS:              Recent Labs    07/10/18 0233 07/11/18 0547  WBC 10.9* 18.8*  HGB 11.1* 5.6*  PLT 274 193               Blood type: --/--/O POS (02/13 0233)  Rubella: Immune (07/18 0000)                     I&O: Intake/Output      02/13 0701 - 02/14 0700 02/14 0701 - 02/15 0700   Urine 100    Emesis/NG output 300    Blood 1100    Total Output 1500    Net -1500         Per delivery summary total EBL:              Physical Exam:             Alert and oriented X3  Lungs: Clear and unlabored  Heart: regular rate and rhythm / no murmurs  Abdomen: soft, non-tender, non-distended              Fundus: firm, non-tender, U-E slightly displaced to the right  Perineum: well approximated 2nd degree repair, +edema, +erythema, +mild ecchymosis. External hemorrhoid noted, not thrombosed.   Lochia: moderate, no clots    Extremities: no edema, no calf pain or tenderness    A/P: PPD # 1, VBAC  2nd degree repair   ABL Anemia     -  VSS, otherwise asymptomatic other than fatigue    -  Insert PIV and Transfuse 2 units PRBCs   - Premedicate with Tylenol 650mg  PO x 1 and Benadryl 25mg  PO x 1   - CBC 4 hrs post transfusion    - Plan to start PO Ferrous sulfate 325mg  BID after blood transfusion   External hemorrhoid   - Proctofoam cream BID   - Continue using Tucks pads   Doing well - stable status  Routine post partum orders  See lactation today   Desires early discharge home today, but will re-evaluate status and feeds this afternoon, otherwise plan to d/c home tomorrow   Consult plan of care: Dr. Billy Coast  Risks vs benefits of transfusion discussed. :Pt acknowledges and wishes to proceed.  Carlean Jews, MSN, CNM [facility]

## 2018-07-12 DIAGNOSIS — O9902 Anemia complicating childbirth: Secondary | ICD-10-CM | POA: Diagnosis not present

## 2018-07-12 LAB — TYPE AND SCREEN
ABO/RH(D): O POS
Antibody Screen: NEGATIVE
UNIT DIVISION: 0
Unit division: 0

## 2018-07-12 LAB — BPAM RBC
Blood Product Expiration Date: 202003102359
Blood Product Expiration Date: 202003102359
ISSUE DATE / TIME: 202002141040
ISSUE DATE / TIME: 202002141252
Unit Type and Rh: 5100
Unit Type and Rh: 5100

## 2018-07-12 MED ORDER — COCONUT OIL OIL: 1.0000 "application " | TOPICAL_OIL | 0 refills | Status: AC | PRN

## 2018-07-12 MED ORDER — HYDROCORTISONE ACE-PRAMOXINE 1-1 % RE FOAM: 1.0000 | Freq: Two times a day (BID) | RECTAL | 0 refills | Status: AC

## 2018-07-12 MED ORDER — IBUPROFEN 600 MG PO TABS: 600.0000 mg | ORAL_TABLET | Freq: Four times a day (QID) | ORAL | 0 refills | Status: AC

## 2018-07-12 MED ORDER — ACETAMINOPHEN 325 MG PO TABS: 650.0000 mg | ORAL_TABLET | ORAL | Status: AC | PRN

## 2018-07-12 MED ORDER — MAGNESIUM OXIDE -MG SUPPLEMENT 400 (240 MG) MG PO TABS: 400.0000 mg | ORAL_TABLET | Freq: Every day | ORAL | Status: AC

## 2018-07-12 MED ORDER — BENZOCAINE-MENTHOL 20-0.5 % EX AERO: 1.0000 "application " | INHALATION_SPRAY | CUTANEOUS | Status: AC | PRN

## 2018-07-12 MED ORDER — POLYSACCHARIDE IRON COMPLEX 150 MG PO CAPS: 150.0000 mg | ORAL_CAPSULE | Freq: Two times a day (BID) | ORAL | 3 refills | Status: AC

## 2018-07-12 NOTE — Discharge Summary (Signed)
OB Discharge Summary  Patient Name: Robin Palmer DOB: [birth year] MRN: [id number]  Date of admission: 07/10/2018 Delivering provider: Olivia Mackie   Date of discharge: 07/12/2018  Admitting diagnosis: Pregnancy Intrauterine pregnancy: [redacted]w[redacted]d     Secondary diagnosis: Additional problems:none     Discharge diagnosis:  Patient Active Problem List   Diagnosis Date Noted  . Perineal laceration, second degree 07/12/2018  . Maternal anemia, with delivery 07/12/2018  . Term pregnancy 07/10/2018  . SVD (spontaneous vaginal delivery) / VBAC 07/10/2018  . Postpartum care following vaginal delivery (2/13) 07/10/2018                                                            Post partum procedures:blood transfusion  Augmentation: Pitocin Pain control: Epidural  Laceration:2nd degree  Episiotomy:None  Complications: Hemorrhage>1055mL  Hospital course:  Onset of Labor With Vaginal Delivery     33 y.o. yo [id number] at [redacted]w[redacted]d was admitted in Active Labor on 07/10/2018. Patient had  labor course as follows:  Membrane Rupture Time/Date: 1:15 AM ,07/10/2018   Intrapartum Procedures: Episiotomy: None [1]                                         Lacerations:  2nd degree [3]  Patient had a delivery of a Viable infant. 07/10/2018  Postpartum hemorrhage 1100 EBL managed with massage and uterotonics.  Patient had a postpartum course complicated by severe acute blood loss anemia, she received 2 units packed red blood cells transfusion.  She is ambulating, tolerating a regular diet, passing flatus, and urinating well. Patient is discharged home in stable condition on 07/12/18.   Physical exam  Vitals:   07/11/18 1315 07/11/18 1430 07/11/18 2235 07/12/18 0531  BP: 95/60 102/62 103/79 109/73  Pulse: 84 86 79 80  Resp: 18 17    Temp: 97.8 F (36.6 C) 97.7 F (36.5 C) (!) 97.3 F (36.3 C) 98 F (36.7 C)  TempSrc: Axillary Oral Oral Oral  SpO2: 100% 100% 100% 98%   General: alert, cooperative  and no distress Lochia: appropriate Uterine Fundus: firm Incision: Healing well with no significant drainage DVT Evaluation: Negative Homan's sign. Calf/Ankle edema is present Labs: Lab Results  Component Value Date   WBC 17.1 (H) 07/11/2018   HGB 8.4 (L) 07/11/2018   HCT 26.2 (L) 07/11/2018   MCV 85.9 07/11/2018   PLT 219 07/11/2018   CMP Latest Ref Rng & Units 09/02/2015  Glucose 65 - 99 mg/dL 81  BUN 6 - 20 mg/dL 9  Creatinine 6.38 - 4.53 mg/dL 6.46(O)  Sodium 032 - 122 mmol/L 141  Potassium 3.5 - 5.1 mmol/L 4.4  Chloride 101 - 111 mmol/L 111  CO2 22 - 32 mmol/L 24  Calcium 8.9 - 10.3 mg/dL 4.8(G)  Total Protein 6.5 - 8.1 g/dL 5.0(I)  Total Bilirubin 0.3 - 1.2 mg/dL 0.5  Alkaline Phos 38 - 126 U/L 216(H)  AST 15 - 41 U/L 43(H)  ALT 14 - 54 U/L 13(L)    Vaccines: TDaP UTD         Flu    UTD  Discharge instruction: per After Visit Summary and "Baby and Me Booklet".  After Visit Meds:  Allergies  as of 07/12/2018   No Known Allergies     Medication List    STOP taking these medications   valACYclovir 500 MG tablet Commonly known as:  VALTREX     TAKE these medications   acetaminophen 325 MG tablet Commonly known as:  TYLENOL Take 2 tablets (650 mg total) by mouth every 4 (four) hours as needed (for pain scale < 4).   benzocaine-Menthol 20-0.5 % Aero Commonly known as:  DERMOPLAST Apply 1 application topically as needed for irritation (perineal discomfort).   coconut oil Oil Apply 1 application topically as needed.   esomeprazole 20 MG capsule Commonly known as:  NEXIUM Take 20 mg by mouth daily at 12 noon.   hydrocortisone-pramoxine rectal foam Commonly known as:  PROCTOFOAM-HC Place 1 applicator rectally 2 (two) times daily.   ibuprofen 600 MG tablet Commonly known as:  ADVIL,MOTRIN Take 1 tablet (600 mg total) by mouth every 6 (six) hours.   iron polysaccharides 150 MG capsule Commonly known as:  NU-IRON Take 1 capsule (150 mg total) by mouth 2  (two) times daily.   Magnesium Oxide 400 (240 Mg) MG Tabs Take 1 tablet (400 mg total) by mouth daily. For prevention of constipation.   prenatal multivitamin Tabs tablet Take 1 tablet by mouth daily at 12 noon.            Discharge Care Instructions  (From admission, onward)         Start     Ordered   07/12/18 0000  Discharge wound care:    Comments:  Sitz baths 2 times /day with warm water x 1 week   07/12/18 0911          Diet: routine diet  Activity: Advance as tolerated. Pelvic rest for 6 weeks.   Postpartum contraception: Not Discussed  Newborn Data: Live born female  Birth Weight: 8 lb 2 oz (3685 g) APGAR: 9, 9  Newborn Delivery   Birth date/time:  07/10/2018 11:04:00 Delivery type:  VBAC, Spontaneous     named Rowdy Baby Feeding: Breast Disposition:home with mother   Delivery Report:  Review the Delivery Report for details.    Follow up: Follow-up Information    Olivia Mackie, MD. Schedule an appointment as soon as possible for a visit in 6 week(s).   Specialty:  Obstetrics and Gynecology Contact information: [address] [place] [address] [phone]             Signed: Cipriano Mile, MSN 07/12/2018, 9:13 AM

## 2018-07-12 NOTE — Progress Notes (Signed)
Patient ID: Robin Palmer, female   DOB: Jun 14, 1985, 33 y.o.   MRN: [id number] Post Partum Day #2  S/P VBAC / PPH / blood transfusion 2U PRBC  Live born female  Birth Weight: 8 lb 2 oz (3685 g) APGAR: 9, 9  Newborn Delivery   Birth date/time:  07/10/2018 11:04:00 Delivery type:  VBAC, Spontaneous    Named Rowdy Delivering provider: Olivia Mackie   circumcision completed Feeding: breast  Pain control at delivery: Epidural   Subjective: No HA, SOB, CP, breast symptoms.  Pain minimal.  Normal vaginal bleeding, no clots.   Voiding freely.    Objective:  VS:  Vitals:   07/11/18 1315 07/11/18 1430 07/11/18 2235 07/12/18 0531  BP: 95/60 102/62 103/79 109/73  Pulse: 84 86 79 80  Resp: 18 17    Temp: 97.8 F (36.6 C) 97.7 F (36.5 C) (!) 97.3 F (36.3 C) 98 F (36.7 C)  TempSrc: Axillary Oral Oral Oral  SpO2: 100% 100% 100% 98%     Intake/Output Summary (Last 24 hours) at 07/12/2018 0840 Last data filed at 07/11/2018 1430 Gross per 24 hour  Intake 711 ml  Output -  Net 711 ml      Recent Labs    07/11/18 0547 07/11/18 1854  WBC 18.8* 17.1*  HGB 5.6* 8.4*  HCT 17.6* 26.2*  PLT 193 219    Blood type: --/--/O POS (02/13 0233) Rubella: Immune (07/18 0000)  Vaccines: TDaP UTD         Flu    UTD  Physical Exam:  General: alert, cooperative and no distress Uterine Fundus: firm Lochia: appropriate Perineum: repair intact, no edema DVT Evaluation: No cords or calf tenderness. Calf/Ankle edema is present.    Assessment/Plan: PPD # 2 / 33 y.o., [id number]    Principal Problem:   Postpartum care following vaginal delivery (2/13) Active Problems:   Term pregnancy   SVD (spontaneous vaginal delivery)   Perineal laceration, second degree   Maternal anemia, with delivery  - S/P 2U PRBC, H&H improved this AM and patient asymptomatic  - started oral Fe and Mag ox    normal postpartum exam  Continue current postpartum care             DC home today w/  instructions  F/U at [facility] in 6 weeks and PRN   LOS: 2 days   Neta Mends, CNM, MSN 07/12/2018, 8:40 AM

## 2018-07-12 NOTE — Lactation Note (Signed)
This note was copied from a baby's chart. Lactation Consultation Note  Patient Name: Boy Baneza Leung Today's Date: 07/12/2018 Reason for consult: Follow-up assessment;Infant weight loss;Term(8% weight loss / 43 hours - 3.8 )  Baby is 46 hours old  LC reviewed and updated the doc flow sheets per dad  Per mom baby recently breast fed for 25 mins and hearing more swallows.  Moms Hgb has increased to 8.4 today. LC encouraged mom to increase her fluids/  Nutritious snacks and meals/ naps and plenty of rest.  LC reviewed sore nipple and engorgement prevention and tx yesterday and per mom  Has no questions.  LC reminded mom to call back for [facility] O/P appt. In about 1 week for pre and post weight To check  Milk supply.  Mother informed of post-discharge support and given phone number to the lactation department, including services for phone call assistance; out-patient appointments; and breastfeeding support group. List of other breastfeeding resources in the community given in the handout. Encouraged mother to call for problems or concerns related to breastfeeding.   Maternal Data    Feeding Feeding Type: (baby recently breast fed - 25 mins )  LATCH Score                   Interventions Interventions: Breast feeding basics reviewed  Lactation Tools Discussed/Used     Consult Status Consult Status: Follow-up Date: (LC recommended calling back for LC O/P appt in about 1 week ) Follow-up type: Out-patient    Matilde Sprang Niesha Bame 07/12/2018, 9:08 AM

## 2018-08-21 DIAGNOSIS — Z130 Encounter for screening for diseases of the blood and blood-forming organs and certain disorders involving the immune mechanism: Secondary | ICD-10-CM | POA: Diagnosis not present

## 2018-08-21 DIAGNOSIS — Z124 Encounter for screening for malignant neoplasm of cervix: Secondary | ICD-10-CM | POA: Diagnosis not present

## 2022-03-26 ENCOUNTER — Ambulatory Visit: Payer: Self-pay | Admitting: Surgery

## 2022-03-26 NOTE — H&P (Signed)
Subjective   Chief Complaint: New Patient (Eval umb hernia )     History of Present Illness: Robin Palmer is a 36 y.o. female who is seen today as an office consultation at the request of Dr. Ronita Palmer for evaluation of New Patient (Eval umb hernia ) .    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  This first appeared about 3 years ago when she was pregnant with her youngest.  Since that time, the patient has resumed vigorous exercise and has lost weight.  She has a visible palpable bulge in this area.  She is able to reduce it.  She denies any obstructive symptoms.  The overall bulge does not seem to be much larger but it is causing some discomfort.  No imaging.  Review of Systems: A complete review of systems was obtained from the patient.  I have reviewed this information and discussed as appropriate with the patient.  See HPI as well for other ROS.  Review of Systems  Constitutional: Negative.   HENT: Negative.    Eyes: Negative.   Respiratory: Negative.    Cardiovascular: Negative.   Gastrointestinal:  Positive for abdominal pain.  Genitourinary: Negative.   Musculoskeletal: Negative.   Skin: Negative.   Neurological: Negative.   Endo/Heme/Allergies: Negative.   Psychiatric/Behavioral: Negative.        Medical History: History reviewed. No pertinent past medical history.  Patient Active Problem List  Diagnosis   GERD (gastroesophageal reflux disease)   H/O LEEP (loop electrosurgical excision procedure) of cervix complicating pregnancy   Umbilical hernia without obstruction or gangrene    Past Surgical History:  Procedure Laterality Date   APPENDECTOMY     CESAREAN DELIVERY       No Known Allergies  Current Outpatient Medications on File Prior to Visit  Medication Sig Dispense Refill   busPIRone (BUSPAR) 5 MG tablet Take 5 mg by mouth 2 (two) times daily     multivitamin  tablet Take 1 tablet by mouth once daily     No current facility-administered medications on file prior to visit.    History reviewed. No pertinent family history.   Social History   Tobacco Use  Smoking Status Never  Smokeless Tobacco Never     Social History   Socioeconomic History   Marital status: Single  Tobacco Use   Smoking status: Never   Smokeless tobacco: Never  Substance and Sexual Activity   Alcohol use: Never   Drug use: Never    Objective:    Vitals:   03/26/22 0925  BP: 112/68  Pulse: 86  Temp: 37.2 C (98.9 F)  SpO2: 95%  Weight: 56.6 kg (124 lb 12.8 oz)  Height: 160 cm (5\' 3" )    Body mass index is 22.11 kg/m.  Physical Exam   Constitutional:  WDWN in NAD, conversant, no obvious deformities; lying in bed comfortably Eyes:  Pupils equal, round; sclera anicteric; moist conjunctiva; no lid lag HENT:  Oral mucosa moist; good dentition  Neck:  No masses palpated, trachea midline; no thyromegaly Lungs:  CTA bilaterally; normal respiratory effort CV:  Regular rate and rhythm; no murmurs; extremities well-perfused with no edema Abd:  +bowel sounds, soft, non-tender, no palpable organomegaly; 2.0 cm bulge approximately 3 cm above the umbilicus.  This is reducible when she is supine. Musc: Normal gait; no apparent clubbing or cyanosis in extremities Lymphatic:  No palpable cervical or axillary lymphadenopathy  Skin:  Warm, dry; no sign of jaundice Psychiatric - alert and oriented x 4; calm mood and affect     Assessment and Plan:  Diagnoses and all orders for this visit:  Umbilical hernia without obstruction or gangrene     Recommend umbilical hernia repair.The surgical procedure has been discussed with the patient.  Potential risks, benefits, alternative treatments, and expected outcomes have been explained.  All of the patient's questions at this time have been answered.  The likelihood of reaching the patient's treatment goal is good.  The  patient understand the proposed surgical procedure and wishes to proceed.  This defect is too small to repair with mesh.  We will primarily close the defect.  Robin Jews, MD  03/26/2022 9:39 AM
